# Patient Record
Sex: Female | Born: 1980 | Race: Black or African American | Hispanic: No | Marital: Single | Smoking: Former smoker
Health system: Southern US, Community
[De-identification: ages and names within clinical notes are randomized; demographics above are authoritative.]

## PROBLEM LIST (undated history)

## (undated) DIAGNOSIS — R87619 Unspecified abnormal cytological findings in specimens from cervix uteri: Secondary | ICD-10-CM

## (undated) DIAGNOSIS — K219 Gastro-esophageal reflux disease without esophagitis: Secondary | ICD-10-CM

## (undated) DIAGNOSIS — D649 Anemia, unspecified: Secondary | ICD-10-CM

## (undated) DIAGNOSIS — M1990 Unspecified osteoarthritis, unspecified site: Secondary | ICD-10-CM

## (undated) DIAGNOSIS — F32A Depression, unspecified: Secondary | ICD-10-CM

## (undated) HISTORY — DX: Gastro-esophageal reflux disease without esophagitis: K21.9

## (undated) HISTORY — DX: Unspecified abnormal cytological findings in specimens from cervix uteri: R87.619

## (undated) HISTORY — DX: Depression, unspecified: F32.A

## (undated) HISTORY — DX: Unspecified osteoarthritis, unspecified site: M19.90

## (undated) HISTORY — DX: Anemia, unspecified: D64.9

---
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
## 2005-03-09 ENCOUNTER — Emergency Department: Payer: Self-pay | Admitting: Emergency Medicine

## 2006-05-26 ENCOUNTER — Emergency Department: Payer: Self-pay | Admitting: Emergency Medicine

## 2008-09-15 ENCOUNTER — Emergency Department: Payer: Self-pay | Admitting: Emergency Medicine

## 2009-12-04 ENCOUNTER — Emergency Department: Payer: Self-pay | Admitting: Unknown Physician Specialty

## 2011-03-31 ENCOUNTER — Emergency Department: Payer: Self-pay | Admitting: Emergency Medicine

## 2011-04-07 ENCOUNTER — Emergency Department: Payer: Self-pay | Admitting: Internal Medicine

## 2011-05-29 ENCOUNTER — Emergency Department: Payer: Self-pay | Admitting: *Deleted

## 2011-06-09 ENCOUNTER — Ambulatory Visit: Payer: Self-pay | Admitting: Physician Assistant

## 2012-12-22 ENCOUNTER — Emergency Department: Payer: Self-pay | Admitting: Emergency Medicine

## 2012-12-22 LAB — BASIC METABOLIC PANEL
BUN: 10 mg/dL (ref 7–18)
Calcium, Total: 9.7 mg/dL (ref 8.5–10.1)
Chloride: 107 mmol/L (ref 98–107)
Creatinine: 0.72 mg/dL (ref 0.60–1.30)
EGFR (African American): >60
EGFR (Non-African Amer.): >60
Glucose: 89 mg/dL (ref 65–99)
Osmolality: 270 (ref 275–301)
Potassium: 3.7 mmol/L (ref 3.5–5.1)

## 2012-12-22 LAB — URINALYSIS, COMPLETE
Bilirubin,UR: NEGATIVE
Protein: 30
Squamous Epithelial: 5
WBC UR: 1 /HPF (ref 0–5)

## 2012-12-22 LAB — GC/CHLAMYDIA PROBE AMP

## 2012-12-22 LAB — CBC
HCT: 34.7 % — ABNORMAL LOW (ref 35.0–47.0)
HGB: 11.5 g/dL — ABNORMAL LOW (ref 12.0–16.0)
MCH: 23.8 pg — ABNORMAL LOW (ref 26.0–34.0)
MCHC: 33.2 g/dL (ref 32.0–36.0)
RDW: 14.7 % — ABNORMAL HIGH (ref 11.5–14.5)
WBC: 7.9 10*3/uL (ref 3.6–11.0)

## 2012-12-22 LAB — HCG, QUANTITATIVE, PREGNANCY: Beta Hcg, Quant.: 92605 m[IU]/mL — ABNORMAL HIGH

## 2012-12-22 LAB — WET PREP, GENITAL

## 2013-02-10 ENCOUNTER — Ambulatory Visit: Payer: Self-pay | Admitting: Advanced Practice Midwife

## 2013-03-03 ENCOUNTER — Encounter: Payer: Self-pay | Admitting: Advanced Practice Midwife

## 2013-07-04 ENCOUNTER — Ambulatory Visit: Payer: Self-pay | Admitting: Obstetrics and Gynecology

## 2013-07-04 LAB — CBC WITH DIFFERENTIAL/PLATELET
BASOS ABS: 0 10*3/uL (ref 0.0–0.1)
BASOS PCT: 0.4 %
Eosinophil #: 0 10*3/uL (ref 0.0–0.7)
Eosinophil %: 0.6 %
HCT: 35.5 % (ref 35.0–47.0)
HGB: 11.4 g/dL — AB (ref 12.0–16.0)
Lymphocyte #: 1 10*3/uL (ref 1.0–3.6)
Lymphocyte %: 13.9 %
MCH: 23.7 pg — ABNORMAL LOW (ref 26.0–34.0)
MCHC: 32 g/dL (ref 32.0–36.0)
MCV: 74 fL — AB (ref 80–100)
Monocyte #: 0.6 x10 3/mm (ref 0.2–0.9)
Monocyte %: 8.6 %
Neutrophil #: 5.3 10*3/uL (ref 1.4–6.5)
Neutrophil %: 76.5 %
PLATELETS: 264 10*3/uL (ref 150–440)
RBC: 4.81 10*6/uL (ref 3.80–5.20)
RDW: 15.5 % — ABNORMAL HIGH (ref 11.5–14.5)
WBC: 7 10*3/uL (ref 3.6–11.0)

## 2013-07-07 ENCOUNTER — Inpatient Hospital Stay: Payer: Self-pay | Admitting: Obstetrics and Gynecology

## 2013-07-08 LAB — HEMATOCRIT: HCT: 32.1 % — ABNORMAL LOW (ref 35.0–47.0)

## 2014-09-12 NOTE — Op Note (Signed)
PATIENT NAME:  Meagan Medina, Meagan Medina MR#:  [id number] DATE OF BIRTH:  [birth year]  DATE OF PROCEDURE:  07/07/2013  PREOPERATIVE DIAGNOSES:  1.  Medina 39 + 0 weeks estimated gestational age.  2.  Breech presentation.   POSTOPERATIVE DIAGNOSES:  1.  Medina 39 + 0 weeks estimated gestational age.  2.  Breech presentation.  e PROCEDURE:  1.  Primary low transverse cesarean section.  2.  Breech extraction. 3.  On-Q pump placement.   ANESTHESIA: Spinal.   SURGEON: Jennell Cornerhomas Markeshia Giebel, MD.  INDICATION: This is Medina 34 year old gravida 1, para 0 patient with EDC of 07/14/2013. The patient was noted to be in breech presentation on recent ultrasound. Ultrasound the day of the procedure reconfirms breech presentation.   PROCEDURE: After adequate spinal anesthesia, the patient was placed in the dorsal supine position with Medina hip roll under the right side. The patient's prepped and draped in normal sterile fashion. Medina Pfannenstiel incision was made. Sharp dissection was used to identify the fascia. The fascia was opened in the midline and opened in Medina transverse fashion. The superior aspect of the fascia was grasped with Kocher clamps and the recti muscles dissected free. The inferior aspect of the fascia was grasped with Kocher clamps and the pyramidalis muscle was dissected free. Entry into the peritoneal cavity was accomplished sharply. The vesicouterine peritoneal fold was identified and bladder flap was created and the bladder was reflected inferiorly. Medina low transverse uterine incision was made. Upon entry into the endometrial cavity, clear fluid resulted. The incision was extended with blunt transverse traction. Frank breech was identified. Buttocks was delivered through the incision followed by delivery of the legs without difficulty. Legs were then draped with Medina wound towel and the arms were delivered with Medina medial sweeping motion without difficulty and the head was delivered without difficulty. The cord was doubly  clamped and Medina viable, active female was passed to the neonatologist, who assigned Apgar scores of 8 and 9, weight 3340 grams.  The placenta was manually delivered. The uterus was exteriorized and the endometrial cavity was wiped clean with laparotomy tape. The cervix was opened with Medina ring forceps. The ring forceps was passed off the operative field. The uterine incision was then closed with 1 chromic suture in Medina running and locking fashion with good approximation of edges. Good hemostasis was noted. The posterior cul-de-sac was suctioned and irrigated. The uterus was placed back into the abdominal cavity. Fallopian tubes and ovaries appeared normal. The uterine incision again appeared hemostatic. Interceed was placed over the uterine incision in Medina T-shaped fashion. The superior aspect of the fascia was regrasped with Kocher clamps and the On-Q pump catheters were advanced from an infraumbilical position to Medina subfascial position. The fascia was closed over top of these catheters without difficulty, 0 Vicryl suture used. Good hemostasis. Good approximation of tissues. Subcutaneous tissues were irrigated and bovied for hemostasis. The skin was reapproximated with staples. Dermabond was then used to secure the catheters at the skin level and catheters were Steri-Stripped to the skin and Tegaderm was placed over top of this. Each catheter was loaded with 5 mL of 0.5% Marcaine. There were no complications. Estimated blood loss 500 mL. Intraoperative fluids 1000 mL. The patient was taken to the recovery room in good condition. The patient did receive 2 grams of IV Ancef prior to commencement of the case.   ____________________________ Suzy Bouchardhomas J. Diksha Tagliaferro, MD tjs:aw D: 07/07/2013 09:57:59 ET T: 07/07/2013 10:19:17 ET JOB#: [id number]  cc: Maisie Fushomas  Cloyde Reams, MD, <Dictator> Suzy Bouchard MD ELECTRONICALLY SIGNED 07/08/2013 9:20

## 2019-08-25 ENCOUNTER — Other Ambulatory Visit: Payer: Self-pay

## 2019-08-25 ENCOUNTER — Encounter: Payer: Self-pay | Admitting: Family Medicine

## 2019-08-25 ENCOUNTER — Ambulatory Visit: Payer: Self-pay

## 2019-08-25 ENCOUNTER — Ambulatory Visit (LOCAL_COMMUNITY_HEALTH_CENTER): Payer: Self-pay | Admitting: Family Medicine

## 2019-08-25 VITALS — BP 121/80 | Ht 65.0 in | Wt 198.8 lb

## 2019-08-25 DIAGNOSIS — B9689 Other specified bacterial agents as the cause of diseases classified elsewhere: Secondary | ICD-10-CM

## 2019-08-25 DIAGNOSIS — Z3009 Encounter for other general counseling and advice on contraception: Secondary | ICD-10-CM

## 2019-08-25 DIAGNOSIS — Z113 Encounter for screening for infections with a predominantly sexual mode of transmission: Secondary | ICD-10-CM

## 2019-08-25 DIAGNOSIS — Z30013 Encounter for initial prescription of injectable contraceptive: Secondary | ICD-10-CM

## 2019-08-25 DIAGNOSIS — N6310 Unspecified lump in the right breast, unspecified quadrant: Secondary | ICD-10-CM | POA: Insufficient documentation

## 2019-08-25 LAB — WET PREP FOR TRICH, YEAST, CLUE
Trichomonas Exam: NEGATIVE
Yeast Exam: NEGATIVE

## 2019-08-25 MED ORDER — METRONIDAZOLE 500 MG PO TABS: 500.0000 mg | ORAL_TABLET | Freq: Two times a day (BID) | ORAL | 0 refills | Status: AC

## 2019-08-25 MED ORDER — MEDROXYPROGESTERONE ACETATE 150 MG/ML IM SUSP
150.0000 mg | INTRAMUSCULAR | Status: DC
  Administered 2019-08-25: 150 mg via INTRAMUSCULAR

## 2019-08-25 NOTE — Progress Notes (Signed)
Family Planning Visit  Subjective:  Meagan Medina is a 39 y.o. being seen today for  Chief Complaint  Patient presents with  . Contraception  . Exposure to STD    Pt has Breast mass, right on their problem list.  HPI  Patient reports she is here for STI screening and BCM. She has had discharge x1 wk, has had BV in the past. Denies abd pain, n/v, fever. Last sex ~3 yrs ago.  She has been on Depo in the past, would like to restart this.  Pt denies all of the following, which are contraindications to Depo use: Known breast cancer Pregnancy Also denies: Hypertension (CDC cat 2 if mild, cat 3 if severe) Severe cirrhosis, hepatocellular adenoma Diabetes with nephrosis or vascular complications Ischemic heart disease or multiple risk factors for atherosclerotic disease, and some forms of lupus Unexplained vaginal bleeding Pregnancy planned within the next year Long-term use of corticosteroid therapy in women with a history of, or risk factors for, nontraumatic (frailty) fractures.  Current use of aminoglutethimide (usually for the treatment of Cushing's syndrome) because aminoglutethimide may increase metabolism of progestins   Patient's last menstrual period was 07/31/2019 (exact date). Last sex: 3 years ago BCM: none Pt desires EC? n/a  Last pap: 2019, results normal per pt. She has remote hx of abnormal paps but states this one was ok, only regular follow up needed.  Last breast exam: 2019. No family hx of breast cancer. Has known mass on R breast near nipple since age 60, was tested at [facility], told benign. States it has been getting smaller in recent years.   Patient reports 0 partner(s) in last year. Do they desire STI screening (if no, why not)? Yes, but declines HIV and syphilis screening.   Does the patient desire a pregnancy in the next year? No    38 y.o., Body mass index is 33.08 kg/m. - Is patient eligible for HA1C diabetes screening based on BMI and age >50?   yes  Does the patient have a current or past history of drug use? no No components found for: HCV  See flowsheet for other program required questions.   Health Maintenance Due  Topic Date Due  . HIV Screening  Never done  . TETANUS/TDAP  Never done  . PAP SMEAR-Modifier  Never done    ROS  The following portions of the patient's history were reviewed and updated as appropriate: allergies, current medications, past family history, past medical history, past social history, past surgical history and problem list. Problem list updated.  Objective:  BP 121/80   Ht 5\' 5"  (1.651 m)   Wt 198 lb 12.8 oz (90.2 kg)   LMP 07/31/2019 (Exact Date)   BMI 33.08 kg/m    Physical Exam Vitals and nursing note reviewed.  Constitutional:      Appearance: Normal appearance.  HENT:     Head: Normocephalic and atraumatic.     Mouth/Throat:     Mouth: Mucous membranes are moist.     Pharynx: Oropharynx is clear. No oropharyngeal exudate or posterior oropharyngeal erythema.  Pulmonary:     Effort: Pulmonary effort is normal.  Chest:     Breasts:        Right: Mass present. No swelling, bleeding, inverted nipple, nipple discharge, skin change or tenderness.        Left: No swelling, bleeding, inverted nipple, mass, nipple discharge, skin change or tenderness.    Abdominal:     General: Abdomen is flat.  Palpations: There is no mass.     Tenderness: There is no abdominal tenderness. There is no rebound.  Genitourinary:    General: Normal vulva.     Exam position: Lithotomy position.     Pubic Area: No rash or pubic lice.      Labia:        Right: No rash or lesion.        Left: No rash or lesion.      Vagina: Vaginal discharge (white, small amount, ph>4.5) present. No erythema, bleeding or lesions.     Cervix: No cervical motion tenderness, discharge, friability, lesion or erythema.     Uterus: Normal.      Adnexa: Right adnexa normal and left adnexa normal.     Rectum: Normal.   Lymphadenopathy:     Head:     Right side of head: No preauricular or posterior auricular adenopathy.     Left side of head: No preauricular or posterior auricular adenopathy.     Cervical: No cervical adenopathy.     Upper Body:     Right upper body: No supraclavicular or axillary adenopathy.     Left upper body: No supraclavicular or axillary adenopathy.     Lower Body: No right inguinal adenopathy. No left inguinal adenopathy.  Skin:    General: Skin is warm and dry.     Findings: No rash.  Neurological:     Mental Status: She is alert and oriented to person, place, and time.        Assessment and Plan:  Meagan Medina is a 39 y.o. female presenting to the [facility] Department for a well woman exam/family planning visit  Contraception counseling: Reviewed all forms of birth control options in the tiered based approach including abstinence; over the counter/barrier methods; hormonal contraceptive medication including pill, patch, ring, injection, contraceptive implant; hormonal and nonhormonal IUDs; permanent sterilization options including vasectomy and the various tubal sterilization modalities. Risks, benefits, how to discontinue and typical effectiveness rates were reviewed.  Questions were answered.  Written information was also given to the patient to review.  Patient desires depo, this was prescribed for patient. She will follow up in  3 months for surveillance.  She was told to call with any further questions, or with any concerns about this method of contraception.  Emphasized use of condoms 100% of the time for STI prevention.  Emergency Contraception: n/a d/t last intercourse 3 yrs ago.   1. Encounter for initial prescription of injectable contraceptive -Rx depo x1 yr. Counseling as above.  - medroxyPROGESTERone (DEPO-PROVERA) injection 150 mg  2. Screening examination for venereal disease -Pt with symptoms. Screenings today as below. Treat wet prep per  standing order. -Patient doesnot meet criteria for HepB, HepC Screening. Declines HIV and syphilis screenings. -Counseled on warning s/sx and when to seek care. Recommended condom use with all sex and discussed importance of condom use for STI prevention. - WET PREP FOR [place], YEAST, [place] [facility]  3. Family planning services -A1c today.  -Pt to sign for pap results, obtained in 2019 and negative per pt.  - Hgb A1c w/o eAG  4. Breast mass, right -Small mobile mass at 6:00 on R areola. Pt states this has been present since age 50, was biopsied in [place] and results benign, states it is getting smaller in past several years. Advised to let us know if it changes/enlargens or if any other breast masses appear.  Return in about 3 months (around 11/24/2019) for Depo.  No future appointments.  Ann Held, PA-C

## 2019-08-25 NOTE — Progress Notes (Signed)
Patient here for STD testing, states allergy to ibuprofen (vomiting, passing out).

## 2019-08-25 NOTE — Progress Notes (Addendum)
Wet mount reviewed, patient treated for BV per SO. Depo given, left deltoid, tolerated well, next Depo card given. ROI for last paps unable to be faxed to [facility], as location is closed and phone number disconnected. Patient needs to sign new ROI for last paps at her next Depo appointment around 11/10/2019. Provider aware of situation.Marland KitchenMarland KitchenBurt Knack, RN

## 2019-08-25 NOTE — Progress Notes (Signed)
Patient here for STD testing, appointment changed to include contraceptive counseling. Currently not using BCM, last sex about 3 years ago.Burt Knack, RN

## 2019-08-26 LAB — HGB A1C W/O EAG: Hgb A1c MFr Bld: 6.1 % — ABNORMAL HIGH (ref 4.8–5.6)

## 2019-08-26 NOTE — Progress Notes (Signed)
This encounter was created in error - please disregard.

## 2019-08-29 ENCOUNTER — Telehealth: Payer: Self-pay

## 2019-08-29 NOTE — Telephone Encounter (Signed)
Phone call received back from pt. Pt informed of A1c result, counseled and provider recommendations given per Samara Snide, PA orders. Pt states she does not have a PCP that she normally goes to; offered to provide a PCP list; pt to look up PCP on-line.

## 2019-08-29 NOTE — Telephone Encounter (Signed)
See provider notes

## 2019-08-29 NOTE — Telephone Encounter (Signed)
Phone call to pt. Left message on voicemail that RN with [facility] is calling for Meagan Medina, and we will try to call her back again. Our number is [phone].

## 2019-08-29 NOTE — Telephone Encounter (Signed)
TC to patient to discuss need for PCP follow-up for Hgb A1c 6.1 which indicates pre-diabetes, see provider notes. LM with number to call.Burt Knack, RN

## 2019-08-29 NOTE — Telephone Encounter (Signed)
-----   Message from Ann Held, PA-C sent at 08/26/2019  1:24 PM EDT ----- Please let pt know their A1c result is high at 6.1. They do not yet have diabetes but they are at risk for diabetes, a condition known as "pre-diabetes." We recommend they contact a primary care provider to follow up on this for further evaluation/recommendations.

## 2019-10-17 ENCOUNTER — Emergency Department: Payer: Medicaid Other

## 2019-10-17 ENCOUNTER — Emergency Department
Admission: EM | Admit: 2019-10-17 | Discharge: 2019-10-17 | Disposition: A | Discharge status: Home / Self Care | Payer: Medicaid Other | Attending: Emergency Medicine | Admitting: Emergency Medicine

## 2019-10-17 ENCOUNTER — Other Ambulatory Visit: Payer: Self-pay

## 2019-10-17 ENCOUNTER — Encounter: Payer: Self-pay | Admitting: Emergency Medicine

## 2019-10-17 DIAGNOSIS — M25531 Pain in right wrist: Secondary | ICD-10-CM | POA: Diagnosis not present

## 2019-10-17 DIAGNOSIS — Z87891 Personal history of nicotine dependence: Secondary | ICD-10-CM | POA: Diagnosis not present

## 2019-10-17 MED ORDER — PREDNISONE 10 MG PO TABS: 40.0000 mg | ORAL_TABLET | Freq: Every day | ORAL | 0 refills | Status: AC

## 2019-10-17 NOTE — ED Triage Notes (Signed)
Pt reports pain to right wrist for the past few days. Denies injuries

## 2019-10-17 NOTE — ED Notes (Signed)
See triage note Presents with right hand and wrist pain   Describes pain as burning in hand and radiates into wrist  Good pulses
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
## 2019-11-04 DIAGNOSIS — G5601 Carpal tunnel syndrome, right upper limb: Secondary | ICD-10-CM | POA: Diagnosis not present

## 2019-12-21 DIAGNOSIS — Z419 Encounter for procedure for purposes other than remedying health state, unspecified: Secondary | ICD-10-CM | POA: Diagnosis not present

## 2020-01-21 DIAGNOSIS — Z419 Encounter for procedure for purposes other than remedying health state, unspecified: Secondary | ICD-10-CM | POA: Diagnosis not present

## 2020-02-20 DIAGNOSIS — Z419 Encounter for procedure for purposes other than remedying health state, unspecified: Secondary | ICD-10-CM | POA: Diagnosis not present

## 2020-03-22 DIAGNOSIS — Z419 Encounter for procedure for purposes other than remedying health state, unspecified: Secondary | ICD-10-CM | POA: Diagnosis not present

## 2020-03-23 ENCOUNTER — Ambulatory Visit: Payer: Self-pay | Admitting: Family Medicine

## 2020-04-21 DIAGNOSIS — Z419 Encounter for procedure for purposes other than remedying health state, unspecified: Secondary | ICD-10-CM | POA: Diagnosis not present

## 2020-05-22 DIAGNOSIS — Z419 Encounter for procedure for purposes other than remedying health state, unspecified: Secondary | ICD-10-CM | POA: Diagnosis not present

## 2020-05-27 ENCOUNTER — Encounter: Payer: Self-pay | Admitting: Family Medicine

## 2020-05-27 ENCOUNTER — Ambulatory Visit (INDEPENDENT_AMBULATORY_CARE_PROVIDER_SITE_OTHER): Payer: Medicaid Other | Admitting: Family Medicine

## 2020-05-27 ENCOUNTER — Other Ambulatory Visit: Payer: Self-pay

## 2020-05-27 VITALS — BP 116/73 | HR 95 | Temp 98.2°F | Resp 18 | Ht 65.0 in | Wt 203.0 lb

## 2020-05-27 DIAGNOSIS — Z23 Encounter for immunization: Secondary | ICD-10-CM | POA: Diagnosis not present

## 2020-05-27 DIAGNOSIS — K219 Gastro-esophageal reflux disease without esophagitis: Secondary | ICD-10-CM | POA: Diagnosis not present

## 2020-05-27 DIAGNOSIS — Z7689 Persons encountering health services in other specified circumstances: Secondary | ICD-10-CM

## 2020-05-27 DIAGNOSIS — H60502 Unspecified acute noninfective otitis externa, left ear: Secondary | ICD-10-CM | POA: Diagnosis not present

## 2020-05-27 MED ORDER — FAMOTIDINE 10 MG PO TABS: 10.0000 mg | ORAL_TABLET | Freq: Two times a day (BID) | ORAL | 1 refills | Status: DC

## 2020-05-27 MED ORDER — NEOMYCIN-POLYMYXIN-HC 3.5-10000-1 OT SOLN: 4.0000 [drp] | Freq: Four times a day (QID) | OTIC | 0 refills | Status: DC

## 2020-05-27 NOTE — Assessment & Plan Note (Signed)
Previously treated with ranitidine and then famotidine with good relief of symptoms.  Interested in restarting on famotidine.  Will restart on famotidine 10mg  BID for GERD.  To f/u if symptoms worsen or fail to improve

## 2020-05-27 NOTE — Progress Notes (Signed)
Subjective:    Patient ID: Meagan Medina, female    DOB: 15-Dec-1980, 40 y.o.   MRN: [id number]  Meagan Medina is a 40 y.o. female presenting on 05/27/2020 for Establish Care, Ear Pain (Constant Left Ear pain w/ fullness x 2 weeks), and Gastroesophageal Reflux (Acid reflux.Pt state certain foods and beverages like coffee, pizza makes her vomit. Cough that she associates with her acid reflux x 1 mth)   HPI  Meagan Medina presents to clinic to establish as a new patient for primary care.  Reports that her previous PCP was at [facility].  Records will be requested.  Past medical, family, and surgical history reviewed w/ pt.  Reports acute concerns for worsening GERD and left ear pain x 2 weeks.  Reports that she was previously on ranitidine and then famotidine in the past with good control, has been out of her prescription, which she is finding has been having some vomiting over the past month with foods that have exacerbated her GERD in the past.  Has left ear pain, that is reported as fullness with discharge x 2 weeks.  Denies recent swimming, putting anything into the ear, popping, clicking, fever, recent illness/fevers.   No flowsheet data found.  Social History   Tobacco Use  . Smoking status: Former Smoker    Years: 1.00    Quit date: 2003    Years since quitting: 19.0  . Smokeless tobacco: Never Used  Vaping Use  . Vaping Use: Never used  Substance Use Topics  . Alcohol use: Not Currently  . Drug use: Never    Review of Systems  Constitutional: Negative.   HENT: Positive for ear discharge and ear pain. Negative for congestion, dental problem, drooling, facial swelling, hearing loss, mouth sores, nosebleeds, postnasal drip, rhinorrhea, sinus pressure, sinus pain, sneezing, sore throat, tinnitus, trouble swallowing and voice change.   Eyes: Negative.   Respiratory: Negative.   Cardiovascular: Negative.   Gastrointestinal: Positive for nausea and vomiting. Negative for  abdominal distention, abdominal pain, anal bleeding, blood in stool, constipation, diarrhea and rectal pain.       Increased GERD  Endocrine: Negative.   Genitourinary: Negative.   Musculoskeletal: Negative.   Skin: Negative.   Allergic/Immunologic: Negative.   Neurological: Negative.   Hematological: Negative.   Psychiatric/Behavioral: Negative.    Per HPI unless specifically indicated above     Objective:    BP 116/73 (BP Location: Left Arm, Patient Position: Sitting, Cuff Size: Normal)   Pulse 95   Temp 98.2 F (36.8 C) (Temporal)   Resp 18   Ht 5\' 5"  (1.651 m)   Wt 203 lb (92.1 kg)   SpO2 99%   BMI 33.78 kg/m   Wt Readings from Last 3 Encounters:  05/27/20 203 lb (92.1 kg)  10/17/19 198 lb (89.8 kg)  08/25/19 198 lb 12.8 oz (90.2 kg)    Physical Exam Vitals and nursing note reviewed.  Constitutional:      General: She is not in acute distress.    Appearance: Normal appearance. She is well-developed and well-groomed. She is not ill-appearing or toxic-appearing.  HENT:     Head: Normocephalic and atraumatic.     Right Ear: Tympanic membrane, ear canal and external ear normal. No drainage, swelling or tenderness. No middle ear effusion. There is no impacted cerumen. No mastoid tenderness. Tympanic membrane is not injected, scarred, perforated, erythematous, retracted or bulging.     Left Ear: Drainage, swelling and tenderness present.  No middle ear effusion. No mastoid tenderness. Tympanic membrane is not injected, scarred, perforated, erythematous, retracted or bulging.     Nose:     Comments: Lizbeth Bark is in place, covering mouth and nose. Eyes:     General: Lids are normal. Vision grossly intact.        Right eye: No discharge.        Left eye: No discharge.     Extraocular Movements: Extraocular movements intact.     Conjunctiva/sclera: Conjunctivae normal.     Pupils: Pupils are equal, round, and reactive to light.  Cardiovascular:     Rate and Rhythm: Normal  rate and regular rhythm.     Pulses: Normal pulses.     Heart sounds: Normal heart sounds. No murmur heard. No friction rub. No gallop.   Pulmonary:     Effort: Pulmonary effort is normal. No respiratory distress.     Breath sounds: Normal breath sounds.  Skin:    General: Skin is warm and dry.     Capillary Refill: Capillary refill takes less than 2 seconds.  Neurological:     General: No focal deficit present.     Mental Status: She is alert and oriented to person, place, and time.  Psychiatric:        Attention and Perception: Attention and perception normal.        Mood and Affect: Mood and affect normal.        Speech: Speech normal.        Behavior: Behavior normal. Behavior is cooperative.        Thought Content: Thought content normal.        Cognition and Memory: Cognition and memory normal.        Judgment: Judgment normal.    Results for orders placed or performed in visit on 08/25/19  WET PREP FOR [place], YEAST, CLUE  Result Value Ref Range   Trichomonas Exam Negative Negative   Yeast Exam Negative Negative   Clue Cell Exam Comment: Negative  Hgb A1c w/o eAG  Result Value Ref Range   Hgb A1c MFr Bld 6.1 (H) 4.8 - 5.6 %      Assessment & Plan:   Problem List Items Addressed This Visit      Digestive   Gastroesophageal reflux disease    Previously treated with ranitidine and then famotidine with good relief of symptoms.  Interested in restarting on famotidine.  Will restart on famotidine 10mg  BID for GERD.  To f/u if symptoms worsen or fail to improve      Relevant Medications   famotidine (PEPCID) 10 MG tablet     Nervous and Auditory   Acute otitis externa of left ear    Left otitis externa will treat with cortisporin ear drops in left ear 4x daily until symptoms resolve.  RTC if symptoms worsen or fail to improve      Relevant Medications   neomycin-polymyxin-hydrocortisone (CORTISPORIN) OTIC solution     Other   Encounter to establish care with new  doctor - Primary    New patient to establish for primary care.  Will RTC in 6 months for CPE      Need for Tdap vaccination    Pt needs tetanus vaccine.  > 10 years since last vaccination.  VIS provided.  Plan: 1. Reviewed tetanus disease and need for vaccination. 2. Administer vaccine today.       Relevant Orders   Tdap vaccine greater than or equal to 7yo IM (  Completed)      Meds ordered this encounter  Medications  . famotidine (PEPCID) 10 MG tablet    Sig: Take 1 tablet (10 mg total) by mouth 2 (two) times daily.    Dispense:  180 tablet    Refill:  1  . neomycin-polymyxin-hydrocortisone (CORTISPORIN) OTIC solution    Sig: Place 4 drops into the left ear 4 (four) times daily.    Dispense:  10 mL    Refill:  0   Follow up plan: Return in about 6 months (around 11/24/2020), or if symptoms worsen or fail to improve, for CPE.   Charlaine Dalton, FNP Family Nurse Practitioner [facility] 05/27/2020, 12:54 PM

## 2020-05-27 NOTE — Assessment & Plan Note (Signed)
Left otitis externa will treat with cortisporin ear drops in left ear 4x daily until symptoms resolve.  RTC if symptoms worsen or fail to improve

## 2020-05-27 NOTE — Patient Instructions (Addendum)
I have sent in a prescription for famotidine 10mg  to take 1 tablet 2x per day for your acid reflux.  Avoid citrus and spicy foods as well as chocolate and caffeine to help reduce your symptoms.  I have sent in a prescription for an ear drop for your left ear to put 4 drops into the left ear 4x per day until your symptoms resolve.    We will plan to see you back in 6 months for your physical and acid reflux follow up visit  You will receive a survey after today's visit either digitally by e-mail or paper by USPS mail. Your experiences and feedback matter to .  Please respond so we know how we are doing as we provide care for you.  Call us with any questions/concerns/needs.  It is my goal to be available to you for your health concerns.  Thanks for choosing me to be a partner in your healthcare needs!  [place], FNP-C Family Nurse Practitioner [facility] Phone: [phone]

## 2020-05-27 NOTE — Assessment & Plan Note (Signed)
New patient to establish for primary care.  Will RTC in 6 months for CPE

## 2020-05-27 NOTE — Assessment & Plan Note (Signed)
Pt needs tetanus vaccine.  > 10 years since last vaccination.  VIS provided.  Plan: 1. Reviewed tetanus disease and need for vaccination. 2. Administer vaccine today.  

## 2020-06-22 DIAGNOSIS — Z419 Encounter for procedure for purposes other than remedying health state, unspecified: Secondary | ICD-10-CM | POA: Diagnosis not present

## 2020-06-24 ENCOUNTER — Telehealth: Payer: Self-pay | Admitting: Licensed Clinical Social Worker

## 2020-06-24 NOTE — Telephone Encounter (Signed)
Patient left vm for LCSW on 06/23/20.

## 2020-08-01 DIAGNOSIS — F321 Major depressive disorder, single episode, moderate: Secondary | ICD-10-CM | POA: Diagnosis not present

## 2020-08-02 DIAGNOSIS — F321 Major depressive disorder, single episode, moderate: Secondary | ICD-10-CM | POA: Diagnosis not present

## 2020-08-03 DIAGNOSIS — F321 Major depressive disorder, single episode, moderate: Secondary | ICD-10-CM | POA: Diagnosis not present

## 2020-08-04 DIAGNOSIS — F321 Major depressive disorder, single episode, moderate: Secondary | ICD-10-CM | POA: Diagnosis not present

## 2020-08-05 DIAGNOSIS — F321 Major depressive disorder, single episode, moderate: Secondary | ICD-10-CM | POA: Diagnosis not present

## 2020-08-06 DIAGNOSIS — F321 Major depressive disorder, single episode, moderate: Secondary | ICD-10-CM | POA: Diagnosis not present

## 2020-08-07 DIAGNOSIS — F321 Major depressive disorder, single episode, moderate: Secondary | ICD-10-CM | POA: Diagnosis not present

## 2020-08-08 DIAGNOSIS — F321 Major depressive disorder, single episode, moderate: Secondary | ICD-10-CM | POA: Diagnosis not present

## 2020-08-09 DIAGNOSIS — F321 Major depressive disorder, single episode, moderate: Secondary | ICD-10-CM | POA: Diagnosis not present

## 2020-08-10 DIAGNOSIS — F321 Major depressive disorder, single episode, moderate: Secondary | ICD-10-CM | POA: Diagnosis not present

## 2020-08-11 DIAGNOSIS — F321 Major depressive disorder, single episode, moderate: Secondary | ICD-10-CM | POA: Diagnosis not present

## 2020-08-12 DIAGNOSIS — F321 Major depressive disorder, single episode, moderate: Secondary | ICD-10-CM | POA: Diagnosis not present

## 2020-08-13 DIAGNOSIS — F321 Major depressive disorder, single episode, moderate: Secondary | ICD-10-CM | POA: Diagnosis not present

## 2020-08-14 DIAGNOSIS — F321 Major depressive disorder, single episode, moderate: Secondary | ICD-10-CM | POA: Diagnosis not present

## 2020-08-15 DIAGNOSIS — F321 Major depressive disorder, single episode, moderate: Secondary | ICD-10-CM | POA: Diagnosis not present

## 2020-08-16 DIAGNOSIS — F321 Major depressive disorder, single episode, moderate: Secondary | ICD-10-CM | POA: Diagnosis not present

## 2020-08-17 DIAGNOSIS — F321 Major depressive disorder, single episode, moderate: Secondary | ICD-10-CM | POA: Diagnosis not present

## 2020-08-18 DIAGNOSIS — F321 Major depressive disorder, single episode, moderate: Secondary | ICD-10-CM | POA: Diagnosis not present

## 2020-08-19 DIAGNOSIS — F321 Major depressive disorder, single episode, moderate: Secondary | ICD-10-CM | POA: Diagnosis not present

## 2020-08-20 DIAGNOSIS — F321 Major depressive disorder, single episode, moderate: Secondary | ICD-10-CM | POA: Diagnosis not present

## 2020-08-21 DIAGNOSIS — F321 Major depressive disorder, single episode, moderate: Secondary | ICD-10-CM | POA: Diagnosis not present

## 2020-08-22 DIAGNOSIS — F321 Major depressive disorder, single episode, moderate: Secondary | ICD-10-CM | POA: Diagnosis not present

## 2020-08-23 DIAGNOSIS — F321 Major depressive disorder, single episode, moderate: Secondary | ICD-10-CM | POA: Diagnosis not present

## 2020-08-24 DIAGNOSIS — F321 Major depressive disorder, single episode, moderate: Secondary | ICD-10-CM | POA: Diagnosis not present

## 2020-08-25 DIAGNOSIS — F321 Major depressive disorder, single episode, moderate: Secondary | ICD-10-CM | POA: Diagnosis not present

## 2020-08-26 DIAGNOSIS — F321 Major depressive disorder, single episode, moderate: Secondary | ICD-10-CM | POA: Diagnosis not present

## 2020-08-27 DIAGNOSIS — F321 Major depressive disorder, single episode, moderate: Secondary | ICD-10-CM | POA: Diagnosis not present

## 2020-08-28 DIAGNOSIS — F321 Major depressive disorder, single episode, moderate: Secondary | ICD-10-CM | POA: Diagnosis not present

## 2020-08-29 DIAGNOSIS — F321 Major depressive disorder, single episode, moderate: Secondary | ICD-10-CM | POA: Diagnosis not present

## 2020-08-30 DIAGNOSIS — F321 Major depressive disorder, single episode, moderate: Secondary | ICD-10-CM | POA: Diagnosis not present

## 2020-08-31 DIAGNOSIS — F321 Major depressive disorder, single episode, moderate: Secondary | ICD-10-CM | POA: Diagnosis not present

## 2020-09-01 ENCOUNTER — Other Ambulatory Visit: Payer: Self-pay

## 2020-09-01 DIAGNOSIS — F321 Major depressive disorder, single episode, moderate: Secondary | ICD-10-CM | POA: Diagnosis not present

## 2020-09-01 NOTE — Patient Outreach (Signed)
  Medicaid Managed Care Social Work Note  09/01/2020 Name:  Meagan Medina MRN:  [id number] DOB:  1980-06-17  Meagan Medina is an 40 y.o. year old female who is a primary patient of Anitra Lauth, Jodelle Gross, FNP.  The [facility] Managed Care Coordination team was consulted for assistance with:  Transportation Needs   Ms. Leite was given information about Medicaid Managed CareCoordination services today. Inocente Salles agreed to services and verbal consent obtained.  Engaged with patient  for by telephone forinitial visit in response to referral for case management and/or care coordination services.   Assessments/Interventions:  Review of past medical history, allergies, medications, health status, including review of consultants reports, laboratory and other test data, was performed as part of comprehensive evaluation and provision of chronic care management services.  SDOH: (Social Determinant of Health) assessments and interventions performed:  BSW contacted patient regarding wellcare survey completed for needing transportation. Patient stated transportation is no longer needed. Services and resources are not needed at this time.   Advanced Directives Status:  Not addressed in this encounter.  Care Plan                 Allergies  Allergen Reactions  . Ibuprofen Nausea And Vomiting and Other (See Comments)    Medications Reviewed Today    Reviewed by Tarri Fuller, FNP (Family Nurse Practitioner) on 05/27/20 at 1255  Med List Status: <None>  Medication Order Taking? Sig Documenting Provider Last Dose Status Informant  famotidine (PEPCID) 10 MG tablet [id number] Yes Take 1 tablet (10 mg total) by mouth 2 (two) times daily. Tarri Fuller, FNP  Active   neomycin-polymyxin-hydrocortisone (CORTISPORIN) OTIC solution [id number] Yes Place 4 drops into the left ear 4 (four) times daily. Tarri Fuller, FNP  Active           Patient Active Problem List   Diagnosis Date Noted  . Encounter  to establish care with new doctor 05/27/2020  . Acute otitis externa of left ear 05/27/2020  . Gastroesophageal reflux disease 05/27/2020  . Need for Tdap vaccination 05/27/2020  . Breast mass, right 08/25/2019    Conditions to be addressed/monitored per PCP order:  transportation  There are no care plans that you recently modified to display for this patient.   Follow up:  Patient requests no follow-up at this time.  Plan: The patient has been provided with contact information for the Managed Medicaid care management team and has been advised to call with any health related questions or concerns.    Gus Puma, BSW, [facility]  [place]  High Risk Managed Medicaid Team

## 2020-09-01 NOTE — Patient Instructions (Signed)
Visit Information  Meagan Medina was given information about Medicaid Managed Care team care coordination services as a part of their [facility] Medicaid benefit. Meagan Medina did not consent to engagement with the [facility] Managed Care team.   For questions related to your [facility] health plan, please call: [phone]  If you would like to schedule transportation through your [facility] plan, please call the following number at least 2 days in advance of your appointment: [phone]   Call the [facility] Crisis Line at [phone], at any time, 24 hours a day, 7 days a week. If you are in danger or need immediate medical attention call 911.  Meagan Medina - following are the goals we discussed in your visit today:  Goals Addressed   None     The patient has been provided with contact information for the Managed Medicaid care management team and has been advised to call with any health related questions or concerns.   Meagan Medina, BSW, [facility]  [place]  High Risk Managed Medicaid Team    Following is a copy of your plan of care:  There are no care plans to display for this patient.

## 2020-09-02 DIAGNOSIS — F321 Major depressive disorder, single episode, moderate: Secondary | ICD-10-CM | POA: Diagnosis not present

## 2020-09-03 DIAGNOSIS — F321 Major depressive disorder, single episode, moderate: Secondary | ICD-10-CM | POA: Diagnosis not present

## 2020-09-04 DIAGNOSIS — F321 Major depressive disorder, single episode, moderate: Secondary | ICD-10-CM | POA: Diagnosis not present

## 2020-09-05 DIAGNOSIS — F321 Major depressive disorder, single episode, moderate: Secondary | ICD-10-CM | POA: Diagnosis not present

## 2020-09-06 DIAGNOSIS — F321 Major depressive disorder, single episode, moderate: Secondary | ICD-10-CM | POA: Diagnosis not present

## 2020-09-07 DIAGNOSIS — F321 Major depressive disorder, single episode, moderate: Secondary | ICD-10-CM | POA: Diagnosis not present

## 2020-09-08 DIAGNOSIS — F321 Major depressive disorder, single episode, moderate: Secondary | ICD-10-CM | POA: Diagnosis not present

## 2020-09-09 DIAGNOSIS — F321 Major depressive disorder, single episode, moderate: Secondary | ICD-10-CM | POA: Diagnosis not present

## 2020-09-10 DIAGNOSIS — F321 Major depressive disorder, single episode, moderate: Secondary | ICD-10-CM | POA: Diagnosis not present

## 2020-09-11 DIAGNOSIS — F321 Major depressive disorder, single episode, moderate: Secondary | ICD-10-CM | POA: Diagnosis not present

## 2020-09-12 DIAGNOSIS — F321 Major depressive disorder, single episode, moderate: Secondary | ICD-10-CM | POA: Diagnosis not present

## 2020-09-13 DIAGNOSIS — F321 Major depressive disorder, single episode, moderate: Secondary | ICD-10-CM | POA: Diagnosis not present

## 2020-09-14 DIAGNOSIS — F321 Major depressive disorder, single episode, moderate: Secondary | ICD-10-CM | POA: Diagnosis not present

## 2020-09-15 DIAGNOSIS — F321 Major depressive disorder, single episode, moderate: Secondary | ICD-10-CM | POA: Diagnosis not present

## 2020-09-16 DIAGNOSIS — F321 Major depressive disorder, single episode, moderate: Secondary | ICD-10-CM | POA: Diagnosis not present

## 2020-09-17 DIAGNOSIS — F321 Major depressive disorder, single episode, moderate: Secondary | ICD-10-CM | POA: Diagnosis not present

## 2020-09-18 DIAGNOSIS — F321 Major depressive disorder, single episode, moderate: Secondary | ICD-10-CM | POA: Diagnosis not present

## 2020-09-19 DIAGNOSIS — F321 Major depressive disorder, single episode, moderate: Secondary | ICD-10-CM | POA: Diagnosis not present

## 2020-09-20 DIAGNOSIS — F321 Major depressive disorder, single episode, moderate: Secondary | ICD-10-CM | POA: Diagnosis not present

## 2020-09-21 DIAGNOSIS — F321 Major depressive disorder, single episode, moderate: Secondary | ICD-10-CM | POA: Diagnosis not present

## 2020-09-22 DIAGNOSIS — F321 Major depressive disorder, single episode, moderate: Secondary | ICD-10-CM | POA: Diagnosis not present

## 2020-09-23 DIAGNOSIS — F321 Major depressive disorder, single episode, moderate: Secondary | ICD-10-CM | POA: Diagnosis not present

## 2020-09-24 DIAGNOSIS — F321 Major depressive disorder, single episode, moderate: Secondary | ICD-10-CM | POA: Diagnosis not present

## 2020-09-25 DIAGNOSIS — F321 Major depressive disorder, single episode, moderate: Secondary | ICD-10-CM | POA: Diagnosis not present

## 2020-09-26 DIAGNOSIS — F321 Major depressive disorder, single episode, moderate: Secondary | ICD-10-CM | POA: Diagnosis not present

## 2020-09-27 DIAGNOSIS — F321 Major depressive disorder, single episode, moderate: Secondary | ICD-10-CM | POA: Diagnosis not present

## 2020-09-28 DIAGNOSIS — F321 Major depressive disorder, single episode, moderate: Secondary | ICD-10-CM | POA: Diagnosis not present

## 2020-09-29 DIAGNOSIS — F321 Major depressive disorder, single episode, moderate: Secondary | ICD-10-CM | POA: Diagnosis not present

## 2020-09-30 DIAGNOSIS — F321 Major depressive disorder, single episode, moderate: Secondary | ICD-10-CM | POA: Diagnosis not present

## 2020-10-01 DIAGNOSIS — F321 Major depressive disorder, single episode, moderate: Secondary | ICD-10-CM | POA: Diagnosis not present

## 2020-10-02 DIAGNOSIS — F321 Major depressive disorder, single episode, moderate: Secondary | ICD-10-CM | POA: Diagnosis not present

## 2020-10-03 DIAGNOSIS — F321 Major depressive disorder, single episode, moderate: Secondary | ICD-10-CM | POA: Diagnosis not present

## 2020-10-04 DIAGNOSIS — F321 Major depressive disorder, single episode, moderate: Secondary | ICD-10-CM | POA: Diagnosis not present

## 2020-10-05 DIAGNOSIS — F321 Major depressive disorder, single episode, moderate: Secondary | ICD-10-CM | POA: Diagnosis not present

## 2020-10-06 DIAGNOSIS — F321 Major depressive disorder, single episode, moderate: Secondary | ICD-10-CM | POA: Diagnosis not present

## 2020-10-07 DIAGNOSIS — F321 Major depressive disorder, single episode, moderate: Secondary | ICD-10-CM | POA: Diagnosis not present

## 2020-10-08 DIAGNOSIS — F321 Major depressive disorder, single episode, moderate: Secondary | ICD-10-CM | POA: Diagnosis not present

## 2020-10-09 DIAGNOSIS — F321 Major depressive disorder, single episode, moderate: Secondary | ICD-10-CM | POA: Diagnosis not present

## 2020-10-10 DIAGNOSIS — F321 Major depressive disorder, single episode, moderate: Secondary | ICD-10-CM | POA: Diagnosis not present

## 2020-10-11 DIAGNOSIS — F321 Major depressive disorder, single episode, moderate: Secondary | ICD-10-CM | POA: Diagnosis not present

## 2020-10-12 DIAGNOSIS — F321 Major depressive disorder, single episode, moderate: Secondary | ICD-10-CM | POA: Diagnosis not present

## 2020-10-12 DIAGNOSIS — G5603 Carpal tunnel syndrome, bilateral upper limbs: Secondary | ICD-10-CM | POA: Diagnosis not present

## 2020-10-13 DIAGNOSIS — F321 Major depressive disorder, single episode, moderate: Secondary | ICD-10-CM | POA: Diagnosis not present

## 2020-10-14 DIAGNOSIS — F321 Major depressive disorder, single episode, moderate: Secondary | ICD-10-CM | POA: Diagnosis not present

## 2020-10-15 DIAGNOSIS — F321 Major depressive disorder, single episode, moderate: Secondary | ICD-10-CM | POA: Diagnosis not present

## 2020-10-16 DIAGNOSIS — F321 Major depressive disorder, single episode, moderate: Secondary | ICD-10-CM | POA: Diagnosis not present

## 2020-10-17 DIAGNOSIS — F321 Major depressive disorder, single episode, moderate: Secondary | ICD-10-CM | POA: Diagnosis not present

## 2020-10-18 DIAGNOSIS — F321 Major depressive disorder, single episode, moderate: Secondary | ICD-10-CM | POA: Diagnosis not present

## 2020-10-20 DIAGNOSIS — F321 Major depressive disorder, single episode, moderate: Secondary | ICD-10-CM | POA: Diagnosis not present

## 2020-10-21 DIAGNOSIS — F321 Major depressive disorder, single episode, moderate: Secondary | ICD-10-CM | POA: Diagnosis not present

## 2020-10-24 DIAGNOSIS — F321 Major depressive disorder, single episode, moderate: Secondary | ICD-10-CM | POA: Diagnosis not present

## 2020-10-25 DIAGNOSIS — F321 Major depressive disorder, single episode, moderate: Secondary | ICD-10-CM | POA: Diagnosis not present

## 2020-10-26 DIAGNOSIS — F321 Major depressive disorder, single episode, moderate: Secondary | ICD-10-CM | POA: Diagnosis not present

## 2020-10-27 ENCOUNTER — Encounter: Payer: Medicaid Other | Admitting: Internal Medicine

## 2020-10-27 DIAGNOSIS — F321 Major depressive disorder, single episode, moderate: Secondary | ICD-10-CM | POA: Diagnosis not present

## 2020-10-28 DIAGNOSIS — F321 Major depressive disorder, single episode, moderate: Secondary | ICD-10-CM | POA: Diagnosis not present

## 2020-10-29 DIAGNOSIS — F321 Major depressive disorder, single episode, moderate: Secondary | ICD-10-CM | POA: Diagnosis not present

## 2020-10-30 DIAGNOSIS — F321 Major depressive disorder, single episode, moderate: Secondary | ICD-10-CM | POA: Diagnosis not present

## 2020-10-31 DIAGNOSIS — F321 Major depressive disorder, single episode, moderate: Secondary | ICD-10-CM | POA: Diagnosis not present

## 2020-11-01 DIAGNOSIS — F321 Major depressive disorder, single episode, moderate: Secondary | ICD-10-CM | POA: Diagnosis not present

## 2020-11-02 DIAGNOSIS — F321 Major depressive disorder, single episode, moderate: Secondary | ICD-10-CM | POA: Diagnosis not present

## 2020-11-03 DIAGNOSIS — F321 Major depressive disorder, single episode, moderate: Secondary | ICD-10-CM | POA: Diagnosis not present

## 2020-11-04 DIAGNOSIS — F321 Major depressive disorder, single episode, moderate: Secondary | ICD-10-CM | POA: Diagnosis not present

## 2020-11-05 DIAGNOSIS — F321 Major depressive disorder, single episode, moderate: Secondary | ICD-10-CM | POA: Diagnosis not present

## 2020-11-06 DIAGNOSIS — F321 Major depressive disorder, single episode, moderate: Secondary | ICD-10-CM | POA: Diagnosis not present

## 2020-11-07 DIAGNOSIS — F321 Major depressive disorder, single episode, moderate: Secondary | ICD-10-CM | POA: Diagnosis not present

## 2020-11-08 DIAGNOSIS — F321 Major depressive disorder, single episode, moderate: Secondary | ICD-10-CM | POA: Diagnosis not present

## 2020-11-09 DIAGNOSIS — F321 Major depressive disorder, single episode, moderate: Secondary | ICD-10-CM | POA: Diagnosis not present

## 2020-11-10 DIAGNOSIS — F321 Major depressive disorder, single episode, moderate: Secondary | ICD-10-CM | POA: Diagnosis not present

## 2020-11-11 DIAGNOSIS — F321 Major depressive disorder, single episode, moderate: Secondary | ICD-10-CM | POA: Diagnosis not present

## 2020-11-11 DIAGNOSIS — R202 Paresthesia of skin: Secondary | ICD-10-CM | POA: Diagnosis not present

## 2020-11-12 DIAGNOSIS — F321 Major depressive disorder, single episode, moderate: Secondary | ICD-10-CM | POA: Diagnosis not present

## 2020-11-13 DIAGNOSIS — F321 Major depressive disorder, single episode, moderate: Secondary | ICD-10-CM | POA: Diagnosis not present

## 2020-11-14 DIAGNOSIS — F321 Major depressive disorder, single episode, moderate: Secondary | ICD-10-CM | POA: Diagnosis not present

## 2020-11-15 DIAGNOSIS — F321 Major depressive disorder, single episode, moderate: Secondary | ICD-10-CM | POA: Diagnosis not present

## 2020-11-16 DIAGNOSIS — F321 Major depressive disorder, single episode, moderate: Secondary | ICD-10-CM | POA: Diagnosis not present

## 2020-11-17 DIAGNOSIS — F321 Major depressive disorder, single episode, moderate: Secondary | ICD-10-CM | POA: Diagnosis not present

## 2020-11-18 DIAGNOSIS — M65311 Trigger thumb, right thumb: Secondary | ICD-10-CM | POA: Diagnosis not present

## 2020-11-18 DIAGNOSIS — F321 Major depressive disorder, single episode, moderate: Secondary | ICD-10-CM | POA: Diagnosis not present

## 2020-11-19 DIAGNOSIS — F321 Major depressive disorder, single episode, moderate: Secondary | ICD-10-CM | POA: Diagnosis not present

## 2020-11-20 DIAGNOSIS — F321 Major depressive disorder, single episode, moderate: Secondary | ICD-10-CM | POA: Diagnosis not present

## 2020-11-21 DIAGNOSIS — F321 Major depressive disorder, single episode, moderate: Secondary | ICD-10-CM | POA: Diagnosis not present

## 2020-11-22 DIAGNOSIS — F321 Major depressive disorder, single episode, moderate: Secondary | ICD-10-CM | POA: Diagnosis not present

## 2020-11-23 DIAGNOSIS — F321 Major depressive disorder, single episode, moderate: Secondary | ICD-10-CM | POA: Diagnosis not present

## 2020-11-23 DIAGNOSIS — M65311 Trigger thumb, right thumb: Secondary | ICD-10-CM | POA: Diagnosis not present

## 2020-11-24 DIAGNOSIS — F321 Major depressive disorder, single episode, moderate: Secondary | ICD-10-CM | POA: Diagnosis not present

## 2020-11-25 DIAGNOSIS — F321 Major depressive disorder, single episode, moderate: Secondary | ICD-10-CM | POA: Diagnosis not present

## 2020-11-28 DIAGNOSIS — F321 Major depressive disorder, single episode, moderate: Secondary | ICD-10-CM | POA: Diagnosis not present

## 2020-11-29 DIAGNOSIS — F321 Major depressive disorder, single episode, moderate: Secondary | ICD-10-CM | POA: Diagnosis not present

## 2021-01-20 DIAGNOSIS — F321 Major depressive disorder, single episode, moderate: Secondary | ICD-10-CM | POA: Diagnosis not present

## 2021-01-23 DIAGNOSIS — F321 Major depressive disorder, single episode, moderate: Secondary | ICD-10-CM | POA: Diagnosis not present

## 2021-01-24 DIAGNOSIS — F321 Major depressive disorder, single episode, moderate: Secondary | ICD-10-CM | POA: Diagnosis not present

## 2021-01-25 ENCOUNTER — Other Ambulatory Visit: Payer: Self-pay

## 2021-01-25 ENCOUNTER — Emergency Department
Admission: EM | Admit: 2021-01-25 | Discharge: 2021-01-25 | Disposition: A | Discharge status: Home / Self Care | Payer: Medicaid Other | Attending: Emergency Medicine | Admitting: Emergency Medicine

## 2021-01-25 ENCOUNTER — Emergency Department: Payer: Medicaid Other

## 2021-01-25 DIAGNOSIS — M24151 Other articular cartilage disorders, right hip: Secondary | ICD-10-CM | POA: Diagnosis not present

## 2021-01-25 DIAGNOSIS — R1030 Lower abdominal pain, unspecified: Secondary | ICD-10-CM | POA: Diagnosis not present

## 2021-01-25 DIAGNOSIS — Z87891 Personal history of nicotine dependence: Secondary | ICD-10-CM | POA: Insufficient documentation

## 2021-01-25 DIAGNOSIS — S73191A Other sprain of right hip, initial encounter: Secondary | ICD-10-CM | POA: Diagnosis not present

## 2021-01-25 DIAGNOSIS — X58XXXA Exposure to other specified factors, initial encounter: Secondary | ICD-10-CM | POA: Insufficient documentation

## 2021-01-25 DIAGNOSIS — D259 Leiomyoma of uterus, unspecified: Secondary | ICD-10-CM | POA: Diagnosis not present

## 2021-01-25 DIAGNOSIS — F321 Major depressive disorder, single episode, moderate: Secondary | ICD-10-CM | POA: Diagnosis not present

## 2021-01-25 DIAGNOSIS — S79911A Unspecified injury of right hip, initial encounter: Secondary | ICD-10-CM | POA: Diagnosis present

## 2021-01-25 DIAGNOSIS — M1611 Unilateral primary osteoarthritis, right hip: Secondary | ICD-10-CM | POA: Diagnosis not present

## 2021-01-25 DIAGNOSIS — N83201 Unspecified ovarian cyst, right side: Secondary | ICD-10-CM | POA: Diagnosis not present

## 2021-01-25 LAB — CBC
HCT: 34.3 % — ABNORMAL LOW (ref 36.0–46.0)
Hemoglobin: 10.5 g/dL — ABNORMAL LOW (ref 12.0–15.0)
MCH: 21.4 pg — ABNORMAL LOW (ref 26.0–34.0)
MCHC: 30.6 g/dL (ref 30.0–36.0)
MCV: 69.9 fL — ABNORMAL LOW (ref 80.0–100.0)
Platelets: 285 10*3/uL (ref 150–400)
RBC: 4.91 MIL/uL (ref 3.87–5.11)
RDW: 16.9 % — ABNORMAL HIGH (ref 11.5–15.5)
WBC: 6.1 10*3/uL (ref 4.0–10.5)
nRBC: 0 % (ref 0.0–0.2)

## 2021-01-25 LAB — COMPREHENSIVE METABOLIC PANEL
ALT: 10 U/L (ref 0–44)
AST: 14 U/L — ABNORMAL LOW (ref 15–41)
Albumin: 3.6 g/dL (ref 3.5–5.0)
Alkaline Phosphatase: 61 U/L (ref 38–126)
Anion gap: 10 (ref 5–15)
BUN: 18 mg/dL (ref 6–20)
CO2: 26 mmol/L (ref 22–32)
Calcium: 9 mg/dL (ref 8.9–10.3)
Chloride: 100 mmol/L (ref 98–111)
Creatinine, Ser: 0.87 mg/dL (ref 0.44–1.00)
GFR, Estimated: >60 mL/min (ref >60)
Glucose, Bld: 144 mg/dL — ABNORMAL HIGH (ref 70–99)
Potassium: 3.3 mmol/L — ABNORMAL LOW (ref 3.5–5.1)
Sodium: 136 mmol/L (ref 135–145)
Total Bilirubin: 0.5 mg/dL (ref 0.3–1.2)
Total Protein: 7.1 g/dL (ref 6.5–8.1)

## 2021-01-25 LAB — URINALYSIS, COMPLETE (UACMP) WITH MICROSCOPIC
Bilirubin Urine: NEGATIVE
Glucose, UA: NEGATIVE mg/dL
Ketones, ur: NEGATIVE mg/dL
Leukocytes,Ua: NEGATIVE
Nitrite: NEGATIVE
Protein, ur: NEGATIVE mg/dL
RBC / HPF: >50 RBC/hpf (ref 0–5)
Specific Gravity, Urine: 1.02 (ref 1.005–1.030)
pH: 7 (ref 5.0–8.0)

## 2021-01-25 LAB — POC URINE PREG, ED: Preg Test, Ur: NEGATIVE

## 2021-01-25 LAB — LIPASE, BLOOD: Lipase: 27 U/L (ref 11–51)

## 2021-01-25 MED ORDER — NAPROXEN 500 MG PO TBEC
500.0000 mg | DELAYED_RELEASE_TABLET | Freq: Two times a day (BID) | ORAL | 2 refills | Status: AC
Start: 2021-01-25 — End: 2022-01-25

## 2021-01-25 MED ORDER — HYDROCODONE-ACETAMINOPHEN 5-325 MG PO TABS: 1.0000 | ORAL_TABLET | Freq: Four times a day (QID) | ORAL | 0 refills | Status: DC | PRN

## 2021-01-25 NOTE — ED Triage Notes (Signed)
Pt c/o right hip and RLQ pain for the past week, saays she was seen by ortho last week and put prednisone and pain meds with no relief and feels like she is having more pain into the abd now. Denies N/V/D/fever

## 2021-01-25 NOTE — ED Provider Notes (Addendum)
[facility] Emergency Department Provider Note   ____________________________________________   Event Date/Time   First MD Initiated Contact with Patient 01/25/21 [phone]     (approximate)  I have reviewed the triage vital signs and the nursing notes.   HISTORY  Chief Complaint Groin Pain    HPI Meagan Medina is a 40 y.o. female who reports right sided groin pain going on for about a week.  Patient had a hip x-ray at orthopedics and was given steroids diagnosed with bursitis but it has not gotten any better.  In fact it may have gotten slightly worse.  She is only able to limp a little bit and cannot go up stairs.  She denies any known injury.  She is not running a fever, having any vomiting or any other complaints.         Past Medical History:  Diagnosis Date   Abnormal Pap smear of cervix    Depression    GERD (gastroesophageal reflux disease)     Patient Active Problem List   Diagnosis Date Noted   Encounter to establish care with new doctor 05/27/2020   Acute otitis externa of left ear 05/27/2020   Gastroesophageal reflux disease 05/27/2020   Need for Tdap vaccination 05/27/2020   Breast mass, right 08/25/2019    Past Surgical History:  Procedure Laterality Date   CESAREAN SECTION  07/07/2013   WRIST SURGERY Left 1993   1991, 1992    Prior to Admission medications   Medication Sig Start Date End Date Taking? Authorizing Provider  HYDROcodone-acetaminophen (NORCO/VICODIN) 5-325 MG tablet Take 1 tablet by mouth every 6 (six) hours as needed for moderate pain. 01/25/21  Yes Arnaldo Natal, MD  naproxen (EC NAPROSYN) 500 MG EC tablet Take 1 tablet (500 mg total) by mouth 2 (two) times daily with a meal. 01/25/21 01/25/22 Yes Arnaldo Natal, MD  famotidine (PEPCID) 10 MG tablet Take 1 tablet (10 mg total) by mouth 2 (two) times daily. 05/27/20   Malfi, Jodelle Gross, FNP  neomycin-polymyxin-hydrocortisone (CORTISPORIN) OTIC solution Place 4 drops  into the left ear 4 (four) times daily. 05/27/20   Malfi, Jodelle Gross, FNP    Allergies Ibuprofen  Family History  Problem Relation Age of Onset   Breast cancer Neg Hx     Social History Social History   Tobacco Use   Smoking status: Former    Years: 1.00    Types: Cigarettes    Quit date: 2003    Years since quitting: 19.6   Smokeless tobacco: Never  Vaping Use   Vaping Use: Never used  Substance Use Topics   Alcohol use: Not Currently   Drug use: Never    Review of Systems  Constitutional: No fever/chills Eyes: No visual changes. ENT: No sore throat. Cardiovascular: Denies chest pain. Respiratory: Denies shortness of breath. Gastrointestinal: No abdominal pain.  No nausea, no vomiting.  No diarrhea.  No constipation. Genitourinary: Negative for dysuria. Musculoskeletal: Negative for back pain. Skin: Negative for rash. Neurological: Negative for headaches, focal weakness   ____________________________________________   PHYSICAL EXAM:  VITAL SIGNS: ED Triage Vitals  Enc Vitals Group     BP 01/25/21 0811 124/90     Pulse Rate 01/25/21 0811 81     Resp 01/25/21 0811 17     Temp 01/25/21 0811 98.2 F (36.8 C)     Temp Source 01/25/21 0811 Oral     SpO2 01/25/21 0811 99 %     Weight  01/25/21 0816 160 lb (72.6 kg)     Height 01/25/21 0816 5\' 5"  (1.651 m)     Head Circumference --      Peak Flow --      Pain Score 01/25/21 0816 10     Pain Loc --      Pain Edu? --      Excl. in GC? --     Constitutional: Alert and oriented.  Lying on her left side looks uncomfortable Eyes: Conjunctivae are normal.  Head: Atraumatic. Nose: No congestion/rhinnorhea. Mouth/Throat: Mucous membranes are moist.  Oropharynx non-erythematous. Neck: No stridor.  Cardiovascular: Normal rate, regular rhythm. Grossly normal heart sounds.  Good peripheral circulation. Respiratory: Normal respiratory effort.  No retractions. Lungs CTAB. Gastrointestinal: Soft and nontender to deep  palpation or percussion. No distention. No abdominal bruits.  Musculoskeletal: No lower extremity tenderness nor edema.  Patient is tender in the right groin in the mid part of the right groin.   really superior pubic ramus.  I do not palpate any lymph nodes there.  There is no pain above or below that area. Neurologic:  Normal speech and language. No gross focal neurologic deficits are appreciated.  Pain is making it very difficult for her to lift her right leg off the bed at all. Skin:  Skin is warm, dry and intact. No rash noted.   ____________________________________________   LABS (all labs ordered are listed, but only abnormal results are displayed)  Labs Reviewed  COMPREHENSIVE METABOLIC PANEL - Abnormal; Notable for the following components:      Result Value   Potassium 3.3 (*)    Glucose, Bld 144 (*)    AST 14 (*)    All other components within normal limits  CBC - Abnormal; Notable for the following components:   Hemoglobin 10.5 (*)    HCT 34.3 (*)    MCV 69.9 (*)    MCH 21.4 (*)    RDW 16.9 (*)    All other components within normal limits  URINALYSIS, COMPLETE (UACMP) WITH MICROSCOPIC - Abnormal; Notable for the following components:   Color, Urine ORANGE (*)    Hgb urine dipstick LARGE (*)    Bacteria, UA RARE (*)    All other components within normal limits  LIPASE, BLOOD  POC URINE PREG, ED   ____________________________________________  EKG   ____________________________________________  RADIOLOGY 03/27/21, personally viewed and evaluated these images (plain radiographs) as part of my medical decision making, as well as reviewing the written report by the radiologist.  ED MD interpretation: MRI done read by radiology reviewed by me shows a tear of the right anterior superior labrum in the hip.  This is likely the cause of the patient's pain.  Official radiology report(s): MR PELVIS WO CONTRAST  Result Date: 01/25/2021 CLINICAL DATA:  Right  groin pain for the past week.  No injury. EXAM: MRI PELVIS WITHOUT CONTRAST TECHNIQUE: Multiplanar multisequence MR imaging of the pelvis was performed. No intravenous contrast was administered. COMPARISON:  None. FINDINGS: Bones: There is no evidence of acute fracture, dislocation or avascular necrosis. No focal bone lesion. The visualized sacroiliac joints and symphysis pubis appear normal. Articular cartilage and labrum Articular cartilage: Partial-thickness cartilage loss in the right anterior superior hip joint with prominent subchondral cystic change in the anterior superior acetabulum. Labrum: Right anterior superior labral tear. No paralabral abnormality. Joint or bursal effusion Joint effusion: No significant hip joint effusion. Bursae: No focal periarticular fluid collection. Muscles and tendons  Muscles and tendons: The visualized gluteus, hamstring and iliopsoas tendons appear normal. No muscle edema or atrophy. Other findings Miscellaneous: Multiple uterine fibroids measuring up to 3.8 cm. Trace free fluid in the pelvis, likely physiologic. IMPRESSION: 1. No acute abnormality.  Mild right hip osteoarthritis. 2. Right anterior superior labral tear. 3. Fibroid uterus. Electronically Signed   By: Obie Dredge M.D.   On: 01/25/2021 11:13    ____________________________________________   PROCEDURES  Procedure(s) performed (including Critical Care):  Procedures   ____________________________________________   INITIAL IMPRESSION / ASSESSMENT AND PLAN / ED COURSE       Discussed patient with orthopedics.  They recommend steroids but the patient is already had that stable try their second recommendation which is nonsteroidals.  Additionally I will give her just a little bit of pain medicine for the first couple days and some crutches to help her get around.  She will return if she is worse and she will follow-up with orthopedics.          ____________________________________________   FINAL CLINICAL IMPRESSION(S) / ED DIAGNOSES  Final diagnoses:  Tear of right acetabular labrum, initial encounter     ED Discharge Orders          Ordered    HYDROcodone-acetaminophen (NORCO/VICODIN) 5-325 MG tablet  Every 6 hours PRN        01/25/21 1315    naproxen (EC NAPROSYN) 500 MG EC tablet  2 times daily with meals        01/25/21 1315             Note:  This document was prepared using Dragon voice recognition software and may include unintentional dictation errors.    Arnaldo Natal, MD 01/25/21 1319 I should add that we had been paging Dr. Hyacinth Meeker to consult with him about the labral tear and he was not answering and when we called [facility] it develops that he is on the call schedule erroneously as he is actually on vacation.  We were then able to get someone else from [facility] to consult with.  But that delayed the patient's discharge by about 2 hours.   Arnaldo Natal, MD 01/25/21 1325

## 2021-01-25 NOTE — ED Notes (Signed)
Pt transported to MRI 

## 2021-01-25 NOTE — Discharge Instructions (Addendum)
You have a tear in some of the cartilage in the hip.  This is probably what is causing the pain.  Use the crutches for the next several days to help bear weight on that hip.  Take the Vicodin 1 pill 4 times a day as needed for severe pain.  When that runs out you can try the Naprosyn 1 twice a day with food to help protect your stomach because it can upset it like aspirin or Motrin.  Follow-up with your orthopedic doctors.  Give them a call if they do not call you in the next day or 2 to set up a follow-up appointment sometime in the next week or so.  Please return for increasing pain or any other problems.

## 2021-01-25 NOTE — ED Triage Notes (Signed)
Pt presents ED with c/o of R sided groin pain that has been ongoing for 1 week. Pt states went to ortho had a R hip XRAY and was given steroids and Dx'ed bursitis. Pt denies fevers or chills. Pt able to ambulate but states "I can only limp". NAD noted. Pt denies injury or trauma to that area. No bruising or swelling noted to R groin.

## 2021-01-26 ENCOUNTER — Telehealth: Payer: Self-pay

## 2021-01-26 DIAGNOSIS — F321 Major depressive disorder, single episode, moderate: Secondary | ICD-10-CM | POA: Diagnosis not present

## 2021-01-26 NOTE — Telephone Encounter (Signed)
Transition Care Management Follow-up Telephone Call Date of discharge and from where: 01/25/2021 from [facility] How have you been since you were released from the hospital? Pt stated that she is feeling much better today.  Any questions or concerns? No  Items Reviewed: Did the pt receive and understand the discharge instructions provided? Yes  Medications obtained and verified? Yes  Other? No  Any new allergies since your discharge? No  Dietary orders reviewed? No Do you have support at home? Yes   Functional Questionnaire: (I = Independent and D = Dependent) ADLs: I  Bathing/Dressing- I  Meal Prep- I  Eating- I  Maintaining continence- I  Transferring/Ambulation- I  Managing Meds- I   Follow up appointments reviewed:  PCP Hospital f/u appt confirmed? No   Specialist Hospital f/u appt confirmed? No   Are transportation arrangements needed? No  If their condition worsens, is the pt aware to call PCP or go to the Emergency Dept.? Yes Was the patient provided with contact information for the PCP's office or ED? Yes Was to pt encouraged to call back with questions or concerns? Yes

## 2021-01-27 DIAGNOSIS — F321 Major depressive disorder, single episode, moderate: Secondary | ICD-10-CM | POA: Diagnosis not present

## 2021-01-28 DIAGNOSIS — M24159 Other articular cartilage disorders, unspecified hip: Secondary | ICD-10-CM | POA: Diagnosis not present

## 2021-01-30 DIAGNOSIS — F321 Major depressive disorder, single episode, moderate: Secondary | ICD-10-CM | POA: Diagnosis not present

## 2021-01-31 DIAGNOSIS — F321 Major depressive disorder, single episode, moderate: Secondary | ICD-10-CM | POA: Diagnosis not present

## 2021-02-01 DIAGNOSIS — F321 Major depressive disorder, single episode, moderate: Secondary | ICD-10-CM | POA: Diagnosis not present

## 2021-02-02 DIAGNOSIS — F321 Major depressive disorder, single episode, moderate: Secondary | ICD-10-CM | POA: Diagnosis not present

## 2021-02-03 DIAGNOSIS — F321 Major depressive disorder, single episode, moderate: Secondary | ICD-10-CM | POA: Diagnosis not present

## 2021-02-05 DIAGNOSIS — F321 Major depressive disorder, single episode, moderate: Secondary | ICD-10-CM | POA: Diagnosis not present

## 2021-02-06 DIAGNOSIS — F321 Major depressive disorder, single episode, moderate: Secondary | ICD-10-CM | POA: Diagnosis not present

## 2021-02-07 DIAGNOSIS — F321 Major depressive disorder, single episode, moderate: Secondary | ICD-10-CM | POA: Diagnosis not present

## 2021-02-08 DIAGNOSIS — F321 Major depressive disorder, single episode, moderate: Secondary | ICD-10-CM | POA: Diagnosis not present

## 2021-02-10 DIAGNOSIS — F321 Major depressive disorder, single episode, moderate: Secondary | ICD-10-CM | POA: Diagnosis not present

## 2021-02-13 DIAGNOSIS — F321 Major depressive disorder, single episode, moderate: Secondary | ICD-10-CM | POA: Diagnosis not present

## 2021-02-14 DIAGNOSIS — F321 Major depressive disorder, single episode, moderate: Secondary | ICD-10-CM | POA: Diagnosis not present

## 2021-02-15 DIAGNOSIS — F321 Major depressive disorder, single episode, moderate: Secondary | ICD-10-CM | POA: Diagnosis not present

## 2021-02-16 DIAGNOSIS — M24159 Other articular cartilage disorders, unspecified hip: Secondary | ICD-10-CM | POA: Diagnosis not present

## 2021-02-16 DIAGNOSIS — F321 Major depressive disorder, single episode, moderate: Secondary | ICD-10-CM | POA: Diagnosis not present

## 2021-02-16 DIAGNOSIS — M24151 Other articular cartilage disorders, right hip: Secondary | ICD-10-CM | POA: Diagnosis not present

## 2021-02-17 DIAGNOSIS — F321 Major depressive disorder, single episode, moderate: Secondary | ICD-10-CM | POA: Diagnosis not present

## 2021-02-20 DIAGNOSIS — F321 Major depressive disorder, single episode, moderate: Secondary | ICD-10-CM | POA: Diagnosis not present

## 2021-02-21 DIAGNOSIS — F321 Major depressive disorder, single episode, moderate: Secondary | ICD-10-CM | POA: Diagnosis not present

## 2021-02-22 DIAGNOSIS — F321 Major depressive disorder, single episode, moderate: Secondary | ICD-10-CM | POA: Diagnosis not present

## 2021-02-23 DIAGNOSIS — F321 Major depressive disorder, single episode, moderate: Secondary | ICD-10-CM | POA: Diagnosis not present

## 2021-02-24 DIAGNOSIS — F321 Major depressive disorder, single episode, moderate: Secondary | ICD-10-CM | POA: Diagnosis not present

## 2021-02-24 DIAGNOSIS — M24159 Other articular cartilage disorders, unspecified hip: Secondary | ICD-10-CM | POA: Diagnosis not present

## 2021-02-24 DIAGNOSIS — M24151 Other articular cartilage disorders, right hip: Secondary | ICD-10-CM | POA: Diagnosis not present

## 2021-02-27 DIAGNOSIS — F321 Major depressive disorder, single episode, moderate: Secondary | ICD-10-CM | POA: Diagnosis not present

## 2021-02-28 DIAGNOSIS — F321 Major depressive disorder, single episode, moderate: Secondary | ICD-10-CM | POA: Diagnosis not present

## 2021-03-01 DIAGNOSIS — F321 Major depressive disorder, single episode, moderate: Secondary | ICD-10-CM | POA: Diagnosis not present

## 2021-03-02 DIAGNOSIS — F321 Major depressive disorder, single episode, moderate: Secondary | ICD-10-CM | POA: Diagnosis not present

## 2021-03-03 DIAGNOSIS — F321 Major depressive disorder, single episode, moderate: Secondary | ICD-10-CM | POA: Diagnosis not present

## 2021-03-06 DIAGNOSIS — F321 Major depressive disorder, single episode, moderate: Secondary | ICD-10-CM | POA: Diagnosis not present

## 2021-03-07 DIAGNOSIS — F321 Major depressive disorder, single episode, moderate: Secondary | ICD-10-CM | POA: Diagnosis not present

## 2021-03-08 DIAGNOSIS — F321 Major depressive disorder, single episode, moderate: Secondary | ICD-10-CM | POA: Diagnosis not present

## 2021-03-09 DIAGNOSIS — F321 Major depressive disorder, single episode, moderate: Secondary | ICD-10-CM | POA: Diagnosis not present

## 2021-03-10 ENCOUNTER — Encounter: Payer: Medicaid Other | Admitting: Internal Medicine

## 2021-03-10 DIAGNOSIS — F321 Major depressive disorder, single episode, moderate: Secondary | ICD-10-CM | POA: Diagnosis not present

## 2021-03-13 DIAGNOSIS — F321 Major depressive disorder, single episode, moderate: Secondary | ICD-10-CM | POA: Diagnosis not present

## 2021-03-14 DIAGNOSIS — F321 Major depressive disorder, single episode, moderate: Secondary | ICD-10-CM | POA: Diagnosis not present

## 2021-03-15 DIAGNOSIS — F321 Major depressive disorder, single episode, moderate: Secondary | ICD-10-CM | POA: Diagnosis not present

## 2021-03-16 ENCOUNTER — Ambulatory Visit (INDEPENDENT_AMBULATORY_CARE_PROVIDER_SITE_OTHER): Payer: Medicaid Other | Admitting: Internal Medicine

## 2021-03-16 ENCOUNTER — Other Ambulatory Visit: Payer: Self-pay

## 2021-03-16 ENCOUNTER — Encounter: Payer: Self-pay | Admitting: Internal Medicine

## 2021-03-16 VITALS — BP 106/72 | HR 88 | Temp 97.7°F | Resp 17 | Ht 65.0 in | Wt 196.0 lb

## 2021-03-16 DIAGNOSIS — Z6832 Body mass index (BMI) 32.0-32.9, adult: Secondary | ICD-10-CM

## 2021-03-16 DIAGNOSIS — D649 Anemia, unspecified: Secondary | ICD-10-CM | POA: Insufficient documentation

## 2021-03-16 DIAGNOSIS — Z1159 Encounter for screening for other viral diseases: Secondary | ICD-10-CM | POA: Diagnosis not present

## 2021-03-16 DIAGNOSIS — E6609 Other obesity due to excess calories: Secondary | ICD-10-CM | POA: Insufficient documentation

## 2021-03-16 DIAGNOSIS — K219 Gastro-esophageal reflux disease without esophagitis: Secondary | ICD-10-CM | POA: Diagnosis not present

## 2021-03-16 DIAGNOSIS — E66811 Obesity, class 1: Secondary | ICD-10-CM

## 2021-03-16 DIAGNOSIS — F321 Major depressive disorder, single episode, moderate: Secondary | ICD-10-CM | POA: Diagnosis not present

## 2021-03-16 DIAGNOSIS — M1651 Unilateral post-traumatic osteoarthritis, right hip: Secondary | ICD-10-CM

## 2021-03-16 DIAGNOSIS — M1990 Unspecified osteoarthritis, unspecified site: Secondary | ICD-10-CM | POA: Insufficient documentation

## 2021-03-16 DIAGNOSIS — Z114 Encounter for screening for human immunodeficiency virus [HIV]: Secondary | ICD-10-CM | POA: Diagnosis not present

## 2021-03-16 DIAGNOSIS — D500 Iron deficiency anemia secondary to blood loss (chronic): Secondary | ICD-10-CM

## 2021-03-16 DIAGNOSIS — Z0001 Encounter for general adult medical examination with abnormal findings: Secondary | ICD-10-CM

## 2021-03-16 DIAGNOSIS — R7303 Prediabetes: Secondary | ICD-10-CM | POA: Diagnosis not present

## 2021-03-16 NOTE — Assessment & Plan Note (Signed)
Following with [facility]

## 2021-03-16 NOTE — Assessment & Plan Note (Signed)
Encouraged diet and exercise for weight loss ?

## 2021-03-16 NOTE — Assessment & Plan Note (Signed)
A1C today Encouraged low carb diet and exercise for weight loss 

## 2021-03-16 NOTE — Assessment & Plan Note (Signed)
Avoid foods that trigger your reflux Encouraged weight loss as this can help reduce reflux symptoms Continue Famotidine

## 2021-03-16 NOTE — Assessment & Plan Note (Signed)
CBC today Consider oral iron if she continues to have a drop in her Hemaglobin/Hematocrit

## 2021-03-16 NOTE — Progress Notes (Signed)
Subjective:    Patient ID: Meagan Medina, female    DOB: 08-Apr-1981, 40 y.o.   MRN: [id number]  HPI  Patient presents the clinic today for her annual exam.  She is also due to follow-up chronic conditions.  GERD: Triggered by caffeine and spicy foods.  She denies breakthrough on Famotidine.  There is no upper GI.  Prediabetes: Her last A1c was 6.1%, 08/2019.  She is not taking any oral diabetic medication at this time.  She does not check her sugars.  Anemia: Her last H/H was 10.5/34.3, 01/2021.  She is not taking any oral iron at this time.  She does not follow with hematology.  OA/Labral Tear of Right Hip: She has gotten a steroid injection with minimal relief of symptoms. She is following with [facility].  Flu: Never Tetanus: 05/2020 COVID: Never Pap smear: 2021, [place]: Never Vision screening: as needed Dentist: as needed  Diet: She does eat meat. She consumes fruits and veggies. She tries to avoid fried foods. She drinks mostly water, gatorade or tea. Exercise: None  Review of Systems     Past Medical History:  Diagnosis Date   Abnormal Pap smear of cervix    Depression    GERD (gastroesophageal reflux disease)     Current Outpatient Medications  Medication Sig Dispense Refill   famotidine (PEPCID) 10 MG tablet Take 1 tablet (10 mg total) by mouth 2 (two) times daily. 180 tablet 1   HYDROcodone-acetaminophen (NORCO/VICODIN) 5-325 MG tablet Take 1 tablet by mouth every 6 (six) hours as needed for moderate pain. 15 tablet 0   naproxen (EC NAPROSYN) 500 MG EC tablet Take 1 tablet (500 mg total) by mouth 2 (two) times daily with a meal. 60 tablet 2   neomycin-polymyxin-hydrocortisone (CORTISPORIN) OTIC solution Place 4 drops into the left ear 4 (four) times daily. 10 mL 0   No current facility-administered medications for this visit.    Allergies  Allergen Reactions   Ibuprofen Nausea And Vomiting and Other (See Comments)     Family History  Problem Relation Age of Onset   Breast cancer Neg Hx     Social History   Socioeconomic History   Marital status: Single    Spouse name: Not on file   Number of children: Not on file   Years of education: Not on file   Highest education level: Not on file  Occupational History   Not on file  Tobacco Use   Smoking status: Former    Years: 1.00    Types: Cigarettes    Quit date: 2003    Years since quitting: 19.8   Smokeless tobacco: Never  Vaping Use   Vaping Use: Never used  Substance and Sexual Activity   Alcohol use: Not Currently   Drug use: Never   Sexual activity: Yes    Partners: Male    Birth control/protection: Condom  Other Topics Concern   Not on file  Social History Narrative   Not on file   Social Determinants of Health   Financial Resource Strain: Not on file  Food Insecurity: Not on file  Transportation Needs: Not on file  Physical Activity: Not on file  Stress: Not on file  Social Connections: Not on file  Intimate Partner Violence: Not on file     Constitutional: Denies fever, malaise, fatigue, headache or abrupt weight changes.  HEENT: Denies eye pain, eye redness, ear pain, ringing in the ears, wax buildup, runny nose,  nasal congestion, bloody nose, or sore throat. Respiratory: Denies difficulty breathing, shortness of breath, cough or sputum production.   Cardiovascular: Denies chest pain, chest tightness, palpitations or swelling in the hands or feet.  Gastrointestinal: Denies abdominal pain, bloating, constipation, diarrhea or blood in the stool.  GU: Denies urgency, frequency, pain with urination, burning sensation, blood in urine, odor or discharge. Musculoskeletal: Pt reports right hip pain. Denies decrease in range of motion, difficulty with gait, muscle pain or joint swelling.  Skin: Denies redness, rashes, lesions or ulcercations.  Neurological: Denies dizziness, difficulty with memory, difficulty with speech or  problems with balance and coordination.  Psych: Denies anxiety, depression, SI/HI.  No other specific complaints in a complete review of systems (except as listed in HPI above).  Objective:   Physical Exam BP 106/72 (BP Location: Right Arm, Patient Position: Sitting, Cuff Size: Normal)   Pulse 88   Temp 97.7 F (36.5 C) (Temporal)   Resp 17   Ht 5' 5" (1.651 m)   Wt 196 lb (88.9 kg)   LMP 03/09/2021 (Approximate)   SpO2 100%   BMI 32.62 kg/m   Wt Readings from Last 3 Encounters:  01/25/21 160 lb (72.6 kg)  05/27/20 203 lb (92.1 kg)  10/17/19 198 lb (89.8 kg)    General: Appears her stated age, obese, in NAD. Skin: Warm, dry and intact.  HEENT: Head: normal shape and size; Eyes: EOMs intact;  Neck:  Neck supple, trachea midline. No masses, lumps or thyromegaly present.  Cardiovascular: Normal rate and rhythm. S1,S2 noted.  No murmur, rubs or gallops noted. No JVD or BLE edema.  Pulmonary/Chest: Normal effort and positive vesicular breath sounds. No respiratory distress. No wheezes, rales or ronchi noted.  Abdomen: Soft and nontender. Normal bowel sounds. No distention or masses noted. Liver, spleen and kidneys non palpable. Musculoskeletal: Decreased passive abduction, adduction, internal and external rotation of the right hip secondary to pain. Strength 2/5 BUE/BLE. No difficulty with gait.  Neurological: Alert and oriented. Cranial nerves II-XII grossly intact. Coordination normal.  Psychiatric: Mood and affect normal. Behavior is normal. Judgment and thought content normal.    BMET    Component Value Date/Time   NA 136 01/25/2021 0809   NA 136 12/22/2012 1242   K 3.3 (L) 01/25/2021 0809   K 3.7 12/22/2012 1242   CL 100 01/25/2021 0809   CL 107 12/22/2012 1242   CO2 26 01/25/2021 0809   CO2 23 12/22/2012 1242   GLUCOSE 144 (H) 01/25/2021 0809   GLUCOSE 89 12/22/2012 1242   BUN 18 01/25/2021 0809   BUN 10 12/22/2012 1242   CREATININE 0.87 01/25/2021 0809    CREATININE 0.72 12/22/2012 1242   CALCIUM 9.0 01/25/2021 0809   CALCIUM 9.7 12/22/2012 1242   GFRNONAA >60 01/25/2021 0809   GFRNONAA >60 12/22/2012 1242   GFRAA >60 12/22/2012 1242    Lipid Panel  No results found for: CHOL, TRIG, HDL, CHOLHDL, VLDL, LDLCALC  CBC    Component Value Date/Time   WBC 6.1 01/25/2021 0809   RBC 4.91 01/25/2021 0809   HGB 10.5 (L) 01/25/2021 0809   HGB 11.4 (L) 07/04/2013 1120   HCT 34.3 (L) 01/25/2021 0809   HCT 32.1 (L) 07/08/2013 0616   PLT 285 01/25/2021 0809   PLT 264 07/04/2013 1120   MCV 69.9 (L) 01/25/2021 0809   MCV 74 (L) 07/04/2013 1120   MCH 21.4 (L) 01/25/2021 0809   MCHC 30.6 01/25/2021 0809   RDW 16.9 (H) 01/25/2021  0809   RDW 15.5 (H) 07/04/2013 1120   LYMPHSABS 1.0 07/04/2013 1120   MONOABS 0.6 07/04/2013 1120   EOSABS 0.0 07/04/2013 1120   BASOSABS 0.0 07/04/2013 1120    Hgb A1C Lab Results  Component Value Date   HGBA1C 6.1 (H) 08/25/2019           Assessment & Plan:   Preventative Health Maintenance:  She declines flu shot Tetanus UTD Encouraged her to get her COVID-vaccine Pap smear UTD, will request record We will start screening mammograms at 40 Encouraged her to consume a balanced diet and exercise regimen Advised her to see an eye doctor and dentist annually We will check CBC, c-Met, lipid, A1c, HIV and hep C today  RTC in 6 months follow-up prediabetes Webb Silversmith, NP This visit occurred during the SARS-CoV-2 public health emergency.  Safety protocols were in place, including screening questions prior to the visit, additional usage of staff PPE, and extensive cleaning of exam room while observing appropriate contact time as indicated for disinfecting solutions.

## 2021-03-17 DIAGNOSIS — F321 Major depressive disorder, single episode, moderate: Secondary | ICD-10-CM | POA: Diagnosis not present

## 2021-03-17 LAB — COMPLETE METABOLIC PANEL WITH GFR
AG Ratio: 1.4 (calc) (ref 1.0–2.5)
ALT: 7 U/L (ref 6–29)
AST: 11 U/L (ref 10–30)
Albumin: 4.1 g/dL (ref 3.6–5.1)
Alkaline phosphatase (APISO): 68 U/L (ref 31–125)
BUN: 17 mg/dL (ref 7–25)
CO2: 24 mmol/L (ref 20–32)
Calcium: 9.5 mg/dL (ref 8.6–10.2)
Chloride: 105 mmol/L (ref 98–110)
Creat: 0.88 mg/dL (ref 0.50–0.99)
Globulin: 3 g/dL (calc) (ref 1.9–3.7)
Glucose, Bld: 91 mg/dL (ref 65–99)
Potassium: 4.3 mmol/L (ref 3.5–5.3)
Sodium: 139 mmol/L (ref 135–146)
Total Bilirubin: 0.2 mg/dL (ref 0.2–1.2)
Total Protein: 7.1 g/dL (ref 6.1–8.1)
eGFR: 84 mL/min/{1.73_m2} (ref >=60)

## 2021-03-17 LAB — CBC
HCT: 36.3 % (ref 35.0–45.0)
Hemoglobin: 10.9 g/dL — ABNORMAL LOW (ref 11.7–15.5)
MCH: 21.6 pg — ABNORMAL LOW (ref 27.0–33.0)
MCHC: 30 g/dL — ABNORMAL LOW (ref 32.0–36.0)
MCV: 72 fL — ABNORMAL LOW (ref 80.0–100.0)
MPV: 11.2 fL (ref 7.5–12.5)
Platelets: 329 10*3/uL (ref 140–400)
RBC: 5.04 10*6/uL (ref 3.80–5.10)
RDW: 16.7 % — ABNORMAL HIGH (ref 11.0–15.0)
WBC: 5.3 10*3/uL (ref 3.8–10.8)

## 2021-03-17 LAB — LIPID PANEL
Cholesterol: 262 mg/dL — ABNORMAL HIGH (ref <200)
HDL: 51 mg/dL (ref >=50)
LDL Cholesterol (Calc): 181 mg/dL (calc) — ABNORMAL HIGH
Non-HDL Cholesterol (Calc): 211 mg/dL (calc) — ABNORMAL HIGH (ref <130)
Total CHOL/HDL Ratio: 5.1 (calc) — ABNORMAL HIGH (ref <5.0)
Triglycerides: 157 mg/dL — ABNORMAL HIGH (ref <150)

## 2021-03-17 LAB — HEPATITIS C ANTIBODY
Hepatitis C Ab: NONREACTIVE
SIGNAL TO CUT-OFF: 0.17 (ref <1.00)

## 2021-03-17 LAB — HIV ANTIBODY (ROUTINE TESTING W REFLEX): HIV 1&2 Ab, 4th Generation: NONREACTIVE

## 2021-03-17 LAB — HEMOGLOBIN A1C
Hgb A1c MFr Bld: 6.2 % of total Hgb — ABNORMAL HIGH (ref <5.7)
Mean Plasma Glucose: 131 mg/dL
eAG (mmol/L): 7.3 mmol/L

## 2021-03-20 DIAGNOSIS — F321 Major depressive disorder, single episode, moderate: Secondary | ICD-10-CM | POA: Diagnosis not present

## 2021-03-21 DIAGNOSIS — F321 Major depressive disorder, single episode, moderate: Secondary | ICD-10-CM | POA: Diagnosis not present

## 2021-03-22 DIAGNOSIS — F321 Major depressive disorder, single episode, moderate: Secondary | ICD-10-CM | POA: Diagnosis not present

## 2021-03-23 ENCOUNTER — Telehealth: Payer: Self-pay | Admitting: Internal Medicine

## 2021-03-23 NOTE — Telephone Encounter (Signed)
Pt following up on there results of her labs and starting a new cholesterol medication. Pt went to the pharmacy and it was not there.

## 2021-03-24 DIAGNOSIS — F321 Major depressive disorder, single episode, moderate: Secondary | ICD-10-CM | POA: Diagnosis not present

## 2021-03-24 MED ORDER — ATORVASTATIN CALCIUM 10 MG PO TABS
10.0000 mg | ORAL_TABLET | Freq: Every day | ORAL | 2 refills | Status: DC
Start: 2021-03-24 — End: 2021-07-29

## 2021-03-24 NOTE — Addendum Note (Signed)
Addended by: Lorre Munroe on: 03/24/2021 08:02 AM   Modules accepted: Orders

## 2021-03-24 NOTE — Telephone Encounter (Signed)
Atorvastatin sent to pharmacy.

## 2021-03-25 DIAGNOSIS — F321 Major depressive disorder, single episode, moderate: Secondary | ICD-10-CM | POA: Diagnosis not present

## 2021-03-27 DIAGNOSIS — F321 Major depressive disorder, single episode, moderate: Secondary | ICD-10-CM | POA: Diagnosis not present

## 2021-03-28 DIAGNOSIS — F321 Major depressive disorder, single episode, moderate: Secondary | ICD-10-CM | POA: Diagnosis not present

## 2021-03-29 DIAGNOSIS — F321 Major depressive disorder, single episode, moderate: Secondary | ICD-10-CM | POA: Diagnosis not present

## 2021-03-30 DIAGNOSIS — M25551 Pain in right hip: Secondary | ICD-10-CM | POA: Diagnosis not present

## 2021-03-30 DIAGNOSIS — F321 Major depressive disorder, single episode, moderate: Secondary | ICD-10-CM | POA: Diagnosis not present

## 2021-03-31 DIAGNOSIS — F321 Major depressive disorder, single episode, moderate: Secondary | ICD-10-CM | POA: Diagnosis not present

## 2021-04-03 DIAGNOSIS — F321 Major depressive disorder, single episode, moderate: Secondary | ICD-10-CM | POA: Diagnosis not present

## 2021-04-04 DIAGNOSIS — F321 Major depressive disorder, single episode, moderate: Secondary | ICD-10-CM | POA: Diagnosis not present

## 2021-04-05 DIAGNOSIS — F321 Major depressive disorder, single episode, moderate: Secondary | ICD-10-CM | POA: Diagnosis not present

## 2021-04-06 DIAGNOSIS — F321 Major depressive disorder, single episode, moderate: Secondary | ICD-10-CM | POA: Diagnosis not present

## 2021-04-07 DIAGNOSIS — F321 Major depressive disorder, single episode, moderate: Secondary | ICD-10-CM | POA: Diagnosis not present

## 2021-04-10 DIAGNOSIS — F321 Major depressive disorder, single episode, moderate: Secondary | ICD-10-CM | POA: Diagnosis not present

## 2021-04-11 DIAGNOSIS — F321 Major depressive disorder, single episode, moderate: Secondary | ICD-10-CM | POA: Diagnosis not present

## 2021-04-12 DIAGNOSIS — F321 Major depressive disorder, single episode, moderate: Secondary | ICD-10-CM | POA: Diagnosis not present

## 2021-04-13 DIAGNOSIS — F321 Major depressive disorder, single episode, moderate: Secondary | ICD-10-CM | POA: Diagnosis not present

## 2021-04-15 DIAGNOSIS — F321 Major depressive disorder, single episode, moderate: Secondary | ICD-10-CM | POA: Diagnosis not present

## 2021-04-18 DIAGNOSIS — F321 Major depressive disorder, single episode, moderate: Secondary | ICD-10-CM | POA: Diagnosis not present

## 2021-04-19 ENCOUNTER — Encounter: Payer: Self-pay | Admitting: Internal Medicine

## 2021-04-19 DIAGNOSIS — F321 Major depressive disorder, single episode, moderate: Secondary | ICD-10-CM | POA: Diagnosis not present

## 2021-04-21 DIAGNOSIS — F321 Major depressive disorder, single episode, moderate: Secondary | ICD-10-CM | POA: Diagnosis not present

## 2021-04-22 DIAGNOSIS — F321 Major depressive disorder, single episode, moderate: Secondary | ICD-10-CM | POA: Diagnosis not present

## 2021-04-23 DIAGNOSIS — F321 Major depressive disorder, single episode, moderate: Secondary | ICD-10-CM | POA: Diagnosis not present

## 2021-04-25 DIAGNOSIS — F321 Major depressive disorder, single episode, moderate: Secondary | ICD-10-CM | POA: Diagnosis not present

## 2021-04-26 ENCOUNTER — Ambulatory Visit: Payer: Medicaid Other | Admitting: Internal Medicine

## 2021-04-26 ENCOUNTER — Encounter: Payer: Self-pay | Admitting: Internal Medicine

## 2021-04-26 ENCOUNTER — Other Ambulatory Visit: Payer: Self-pay

## 2021-04-26 VITALS — BP 112/61 | HR 80 | Temp 98.0°F | Resp 17 | Ht 65.0 in | Wt 199.2 lb

## 2021-04-26 DIAGNOSIS — F39 Unspecified mood [affective] disorder: Secondary | ICD-10-CM | POA: Diagnosis not present

## 2021-04-26 DIAGNOSIS — E1169 Type 2 diabetes mellitus with other specified complication: Secondary | ICD-10-CM | POA: Insufficient documentation

## 2021-04-26 DIAGNOSIS — F321 Major depressive disorder, single episode, moderate: Secondary | ICD-10-CM | POA: Diagnosis not present

## 2021-04-26 DIAGNOSIS — R443 Hallucinations, unspecified: Secondary | ICD-10-CM

## 2021-04-26 DIAGNOSIS — E785 Hyperlipidemia, unspecified: Secondary | ICD-10-CM | POA: Insufficient documentation

## 2021-04-26 DIAGNOSIS — F333 Major depressive disorder, recurrent, severe with psychotic symptoms: Secondary | ICD-10-CM

## 2021-04-26 MED ORDER — ARIPIPRAZOLE 5 MG PO TABS
5.0000 mg | ORAL_TABLET | Freq: Every day | ORAL | 0 refills | Status: DC
Start: 2021-04-26 — End: 2021-05-27

## 2021-04-26 NOTE — Patient Instructions (Signed)
Psychosis Psychosis, also called thought disturbance, refers to a severe loss of contact with reality. People having a psychotic episode are not able to think clearly, and their emotions and responses do not match with what is actually happening. People having a psychotic episode may have false beliefs about what is happening or who they are (delusions). They may see, hear, taste, smell, or feel things that are not present (hallucinations). They may also be very upset (agitated), have chaotic behavior, or be very quiet and withdrawn. What are the causes? This condition may be caused by: Very serious mental health (psychiatric) conditions such as schizophrenia, bipolar disorder, or major depression. Use of drugs such as hallucinogens or alcohol. Medical conditions such as delirium or neurological disorders. What are the signs or symptoms? Symptoms of this condition include: Delusions, such as: Feeling a lot of fear or suspicion (paranoia). Believing something that is odd, unrealistic, or false, such as believing that you are someone else. Hallucinations, such as: Hearing or seeing things, smelling odors, experiencing tastes, or feeling bodily sensations. Command hallucinations that direct you to do something that could be dangerous. Disorganized thinking, such as thoughts that jump from one idea to another in a way that does not make sense. Disorganized speech, such as saying things that do not make sense, echoing others, or using words based on their sound rather than their meaning. Inappropriate behavior, such as talking to yourself, showing a clear increase or decrease in activity, or intruding on unfamiliar people. How is this diagnosed? This condition is diagnosed based on an assessment by a health care provider. The health care provider may ask questions about: Your thoughts, feelings, and behavior. Any medical conditions you have. Any use of alcohol or drugs. One or more of the  following may also be done: A physical exam. Blood tests. Brain imaging, such as a CT scan or MRI. A brain wave study (electroencephalogram, or EEG). The health care provider may refer you to a mental health professional for further tests. How is this treated? Treatment for this condition may depend on the cause of the psychosis. Treatment may include one or more of the following: Supportive care and monitoring in the emergency room or hospital. You may need to stay in the hospital if you are a danger to yourself or others. Taking antipsychotic medicines to reduce symptoms and to balance chemicals in the brain. Treating an underlying medical condition. Stopping or reducing drugs that are causing psychosis. Therapy and other supportive programs, such as: Ongoing treatment and care from a mental health professional. Individual or family therapy. Training to learn new skills to cope with the psychosis and prevent further episodes. Follow these instructions at home: Take over-the-counter and prescription medicines only as told by your health care provider. Consult a health care provider before taking over-the-counter medicines, herbs, or supplements. Surround yourself with people who care about you and can help manage your condition. Keep stress under control. Stress may trigger psychosis and make symptoms worse. Maintain a healthy lifestyle. This includes: Eating a healthy diet. Getting enough sleep. Exercising regularly. Avoiding alcohol, nicotine, and recreational drugs. Keep all follow-up visits as told by your health care provider. This is important. Contact a health care provider if: Medicines do not seem to be helping. You or others notice that you: Continue to see, smell, or feel things that are not there. Hear voices telling you to do things. Feel extremely fearful and suspicious that someone or something will harm you. Feel unable to leave  your house. Have trouble taking care  of yourself. You have side effects of medicines, such as: Changes in sleep patterns. Dizziness. Weight gain. Restlessness. Movement changes. Shaking that you cannot control (tremors). Get help right away if: You have serious side effects of medicine, such as: Swelling of the face, lips, tongue, or throat. Fever, confusion, muscle spasms, or seizures. You have serious thoughts about harming yourself or hurting others. If you ever feel like you may hurt yourself or others, or have thoughts about taking your own life, get help right away. You can go to your nearest emergency department or call: Your local emergency services (911 in the [place]  A suicide crisis helpline, such as the National Suicide Prevention Lifeline at 848-776-1903 or 988 in the [place] This is open 24 hours a day. Summary Psychosis refers to a severe loss of contact with reality. People having a psychotic episode are not able to think clearly, and they may have delusions or hallucinations. Psychosis is a serious medical condition that should be treated by a medical professional as soon as possible. Being checked and treated right away can stop or reduce symptoms. This prevents more serious problems from developing. In some cases, treatment may include taking antipsychotic medicines to reduce symptoms and to balance chemicals in the brain. Support programs may help you learn new skills to cope with the psychosis and prevent further episodes. This information is not intended to replace advice given to you by your health care provider. Make sure you discuss any questions you have with your health care provider. Document Revised: 01/13/2021 Document Reviewed: 06/19/2017 Elsevier Patient Education  2022 [employer].

## 2021-04-26 NOTE — Progress Notes (Signed)
Subjective:    Patient ID: Meagan Medina, female    DOB: 04-10-1981, 40 y.o.   MRN: [id number]  HPI  Patient presents the clinic today with complaint of anxiety and depression.  She feels like this is triggered by general life stress.  She is currently living in a hotel since 06/2020 and having a hard time finding a place to live.  She has started having audio and visual hallucinations.  She is in chronic pain, due to have a hip surgery in March 2023.  She is not currently seeing a therapist. She has been treated with Paroxetine in the past. She denies anxiety, SI/HI.  Review of Systems     Past Medical History:  Diagnosis Date   Abnormal Pap smear of cervix    Depression    GERD (gastroesophageal reflux disease)     Current Outpatient Medications  Medication Sig Dispense Refill   atorvastatin (LIPITOR) 10 MG tablet Take 1 tablet (10 mg total) by mouth daily. 30 tablet 2   famotidine (PEPCID) 10 MG tablet Take 1 tablet (10 mg total) by mouth 2 (two) times daily. 180 tablet 1   HYDROcodone-acetaminophen (NORCO/VICODIN) 5-325 MG tablet Take 1 tablet by mouth every 6 (six) hours as needed for moderate pain. 15 tablet 0   naproxen (EC NAPROSYN) 500 MG EC tablet Take 1 tablet (500 mg total) by mouth 2 (two) times daily with a meal. 60 tablet 2   No current facility-administered medications for this visit.    Allergies  Allergen Reactions   Ibuprofen Nausea And Vomiting and Other (See Comments)    Family History  Problem Relation Age of Onset   Breast cancer Neg Hx     Social History   Socioeconomic History   Marital status: Single    Spouse name: Not on file   Number of children: Not on file   Years of education: Not on file   Highest education level: Not on file  Occupational History   Not on file  Tobacco Use   Smoking status: Former    Years: 1.00    Types: Cigarettes    Quit date: 2003    Years since quitting: 19.9   Smokeless tobacco: Never  Vaping Use    Vaping Use: Never used  Substance and Sexual Activity   Alcohol use: Not Currently   Drug use: Never   Sexual activity: Yes    Partners: Male    Birth control/protection: Condom  Other Topics Concern   Not on file  Social History Narrative   Not on file   Social Determinants of Health   Financial Resource Strain: Not on file  Food Insecurity: Not on file  Transportation Needs: Not on file  Physical Activity: Not on file  Stress: Not on file  Social Connections: Not on file  Intimate Partner Violence: Not on file     Constitutional: Denies fever, malaise, fatigue, headache or abrupt weight changes.  Respiratory: Denies difficulty breathing, shortness of breath, cough or sputum production.   Cardiovascular: Denies chest pain, chest tightness, palpitations or swelling in the hands or feet.  Musculoskeletal: Patient reports chronic hip pain, difficulty with gait.  Denies decrease in range of motion, muscle pain or joint swelling.  Skin: Denies redness, rashes, lesions or ulcercations.  Neurological: Denies dizziness, difficulty with memory, difficulty with speech or problems with balance and coordination.  Psych: Patient reports anxiety, depression and hallucinations.  Denies SI/HI.  No other specific complaints in a complete review  of systems (except as listed in HPI above).  Objective:   Physical Exam BP 112/61 (BP Location: Right Arm, Patient Position: Sitting, Cuff Size: Large)   Pulse 80   Temp 98 F (36.7 C) (Temporal)   Resp 17   Ht 5\' 5"  (1.651 m)   Wt 199 lb 3.2 oz (90.4 kg)   SpO2 100%   BMI 33.15 kg/m   Wt Readings from Last 3 Encounters:  03/16/21 196 lb (88.9 kg)  01/25/21 160 lb (72.6 kg)  05/27/20 203 lb (92.1 kg)    General: Appears her stated age, obese, in NAD. Skin: Warm, dry and intact.  HEENT: Head: normal shape and size; Eyes: sclera white, no icterus, conjunctiva pink, PERRLA and EOMs intact;  Cardiovascular: Normal rate and rhythm.   Pulmonary/Chest: Normal effort and positive vesicular breath sounds.  Musculoskeletal: Gait slow and steady with use of rolling walker. Neurological: Alert and oriented.  Psychiatric: Mood and affect flat.  Behavior is normal. Judgment and thought content normal.    BMET    Component Value Date/Time   NA 139 03/16/2021 0958   NA 136 12/22/2012 1242   K 4.3 03/16/2021 0958   K 3.7 12/22/2012 1242   CL 105 03/16/2021 0958   CL 107 12/22/2012 1242   CO2 24 03/16/2021 0958   CO2 23 12/22/2012 1242   GLUCOSE 91 03/16/2021 0958   GLUCOSE 89 12/22/2012 1242   BUN 17 03/16/2021 0958   BUN 10 12/22/2012 1242   CREATININE 0.88 03/16/2021 0958   CALCIUM 9.5 03/16/2021 0958   CALCIUM 9.7 12/22/2012 1242   GFRNONAA >60 01/25/2021 0809   GFRNONAA >60 12/22/2012 1242   GFRAA >60 12/22/2012 1242    Lipid Panel     Component Value Date/Time   CHOL 262 (H) 03/16/2021 0958   TRIG 157 (H) 03/16/2021 0958   HDL 51 03/16/2021 0958   CHOLHDL 5.1 (H) 03/16/2021 0958   LDLCALC 181 (H) 03/16/2021 0958    CBC    Component Value Date/Time   WBC 5.3 03/16/2021 0958   RBC 5.04 03/16/2021 0958   HGB 10.9 (L) 03/16/2021 0958   HGB 11.4 (L) 07/04/2013 1120   HCT 36.3 03/16/2021 0958   HCT 32.1 (L) 07/08/2013 0616   PLT 329 03/16/2021 0958   PLT 264 07/04/2013 1120   MCV 72.0 (L) 03/16/2021 0958   MCV 74 (L) 07/04/2013 1120   MCH 21.6 (L) 03/16/2021 0958   MCHC 30.0 (L) 03/16/2021 0958   RDW 16.7 (H) 03/16/2021 0958   RDW 15.5 (H) 07/04/2013 1120   LYMPHSABS 1.0 07/04/2013 1120   MONOABS 0.6 07/04/2013 1120   EOSABS 0.0 07/04/2013 1120   BASOSABS 0.0 07/04/2013 1120    Hgb A1C Lab Results  Component Value Date   HGBA1C 6.2 (H) 03/16/2021            Assessment & Plan:     03/18/2021, NP This visit occurred during the SARS-CoV-2 public health emergency.  Safety protocols were in place, including screening questions prior to the visit, additional usage of staff PPE, and  extensive cleaning of exam room while observing appropriate contact time as indicated for disinfecting solutions.

## 2021-04-26 NOTE — Assessment & Plan Note (Signed)
Referral to psychiatry for formal evaluation and diagnosis Rx for Abilify 5 mg daily Support offered  Update me in 1 month and let me know how you are doing

## 2021-04-27 DIAGNOSIS — F321 Major depressive disorder, single episode, moderate: Secondary | ICD-10-CM | POA: Diagnosis not present

## 2021-04-28 DIAGNOSIS — F321 Major depressive disorder, single episode, moderate: Secondary | ICD-10-CM | POA: Diagnosis not present

## 2021-04-29 DIAGNOSIS — F321 Major depressive disorder, single episode, moderate: Secondary | ICD-10-CM | POA: Diagnosis not present

## 2021-05-01 DIAGNOSIS — F321 Major depressive disorder, single episode, moderate: Secondary | ICD-10-CM | POA: Diagnosis not present

## 2021-05-02 DIAGNOSIS — F321 Major depressive disorder, single episode, moderate: Secondary | ICD-10-CM | POA: Diagnosis not present

## 2021-05-03 DIAGNOSIS — F321 Major depressive disorder, single episode, moderate: Secondary | ICD-10-CM | POA: Diagnosis not present

## 2021-05-04 DIAGNOSIS — F321 Major depressive disorder, single episode, moderate: Secondary | ICD-10-CM | POA: Diagnosis not present

## 2021-05-07 DIAGNOSIS — F321 Major depressive disorder, single episode, moderate: Secondary | ICD-10-CM | POA: Diagnosis not present

## 2021-05-08 DIAGNOSIS — F321 Major depressive disorder, single episode, moderate: Secondary | ICD-10-CM | POA: Diagnosis not present

## 2021-05-09 DIAGNOSIS — F321 Major depressive disorder, single episode, moderate: Secondary | ICD-10-CM | POA: Diagnosis not present

## 2021-05-10 DIAGNOSIS — F321 Major depressive disorder, single episode, moderate: Secondary | ICD-10-CM | POA: Diagnosis not present

## 2021-05-11 DIAGNOSIS — F321 Major depressive disorder, single episode, moderate: Secondary | ICD-10-CM | POA: Diagnosis not present

## 2021-05-12 DIAGNOSIS — F321 Major depressive disorder, single episode, moderate: Secondary | ICD-10-CM | POA: Diagnosis not present

## 2021-05-15 DIAGNOSIS — F321 Major depressive disorder, single episode, moderate: Secondary | ICD-10-CM | POA: Diagnosis not present

## 2021-05-16 DIAGNOSIS — F321 Major depressive disorder, single episode, moderate: Secondary | ICD-10-CM | POA: Diagnosis not present

## 2021-05-18 DIAGNOSIS — F321 Major depressive disorder, single episode, moderate: Secondary | ICD-10-CM | POA: Diagnosis not present

## 2021-05-21 DIAGNOSIS — F321 Major depressive disorder, single episode, moderate: Secondary | ICD-10-CM | POA: Diagnosis not present

## 2021-05-22 DIAGNOSIS — F321 Major depressive disorder, single episode, moderate: Secondary | ICD-10-CM | POA: Diagnosis not present

## 2021-05-24 DIAGNOSIS — F321 Major depressive disorder, single episode, moderate: Secondary | ICD-10-CM | POA: Diagnosis not present

## 2021-05-25 DIAGNOSIS — F321 Major depressive disorder, single episode, moderate: Secondary | ICD-10-CM | POA: Diagnosis not present

## 2021-05-26 DIAGNOSIS — F321 Major depressive disorder, single episode, moderate: Secondary | ICD-10-CM | POA: Diagnosis not present

## 2021-05-27 ENCOUNTER — Other Ambulatory Visit (HOSPITAL_COMMUNITY): Payer: Self-pay

## 2021-05-27 ENCOUNTER — Other Ambulatory Visit: Payer: Self-pay | Admitting: Internal Medicine

## 2021-05-27 DIAGNOSIS — F321 Major depressive disorder, single episode, moderate: Secondary | ICD-10-CM | POA: Diagnosis not present

## 2021-05-27 MED ORDER — ARIPIPRAZOLE 5 MG PO TABS
5.0000 mg | ORAL_TABLET | Freq: Every day | ORAL | 0 refills | Status: DC
  Filled 2021-05-27: qty 30, 30d supply, fill #0

## 2021-05-27 NOTE — Telephone Encounter (Signed)
I need an update on how this medication is working before it can be refilled

## 2021-05-28 ENCOUNTER — Other Ambulatory Visit (HOSPITAL_COMMUNITY): Payer: Self-pay

## 2021-05-29 DIAGNOSIS — F321 Major depressive disorder, single episode, moderate: Secondary | ICD-10-CM | POA: Diagnosis not present

## 2021-05-30 DIAGNOSIS — F321 Major depressive disorder, single episode, moderate: Secondary | ICD-10-CM | POA: Diagnosis not present

## 2021-05-31 DIAGNOSIS — F321 Major depressive disorder, single episode, moderate: Secondary | ICD-10-CM | POA: Diagnosis not present

## 2021-06-02 DIAGNOSIS — F321 Major depressive disorder, single episode, moderate: Secondary | ICD-10-CM | POA: Diagnosis not present

## 2021-06-04 DIAGNOSIS — F321 Major depressive disorder, single episode, moderate: Secondary | ICD-10-CM | POA: Diagnosis not present

## 2021-06-05 DIAGNOSIS — F321 Major depressive disorder, single episode, moderate: Secondary | ICD-10-CM | POA: Diagnosis not present

## 2021-06-06 DIAGNOSIS — F321 Major depressive disorder, single episode, moderate: Secondary | ICD-10-CM | POA: Diagnosis not present

## 2021-06-07 DIAGNOSIS — F321 Major depressive disorder, single episode, moderate: Secondary | ICD-10-CM | POA: Diagnosis not present

## 2021-06-08 DIAGNOSIS — F321 Major depressive disorder, single episode, moderate: Secondary | ICD-10-CM | POA: Diagnosis not present

## 2021-06-09 DIAGNOSIS — F321 Major depressive disorder, single episode, moderate: Secondary | ICD-10-CM | POA: Diagnosis not present

## 2021-06-13 DIAGNOSIS — F321 Major depressive disorder, single episode, moderate: Secondary | ICD-10-CM | POA: Diagnosis not present

## 2021-06-14 DIAGNOSIS — F321 Major depressive disorder, single episode, moderate: Secondary | ICD-10-CM | POA: Diagnosis not present

## 2021-06-15 DIAGNOSIS — F321 Major depressive disorder, single episode, moderate: Secondary | ICD-10-CM | POA: Diagnosis not present

## 2021-06-16 DIAGNOSIS — F321 Major depressive disorder, single episode, moderate: Secondary | ICD-10-CM | POA: Diagnosis not present

## 2021-06-17 DIAGNOSIS — F321 Major depressive disorder, single episode, moderate: Secondary | ICD-10-CM | POA: Diagnosis not present

## 2021-06-19 DIAGNOSIS — F321 Major depressive disorder, single episode, moderate: Secondary | ICD-10-CM | POA: Diagnosis not present

## 2021-06-20 DIAGNOSIS — F321 Major depressive disorder, single episode, moderate: Secondary | ICD-10-CM | POA: Diagnosis not present

## 2021-06-21 DIAGNOSIS — F321 Major depressive disorder, single episode, moderate: Secondary | ICD-10-CM | POA: Diagnosis not present

## 2021-06-22 DIAGNOSIS — F321 Major depressive disorder, single episode, moderate: Secondary | ICD-10-CM | POA: Diagnosis not present

## 2021-06-23 ENCOUNTER — Ambulatory Visit: Payer: Medicaid Other | Admitting: Internal Medicine

## 2021-06-23 DIAGNOSIS — F321 Major depressive disorder, single episode, moderate: Secondary | ICD-10-CM | POA: Diagnosis not present

## 2021-06-24 ENCOUNTER — Ambulatory Visit: Payer: Self-pay | Admitting: Psychiatry

## 2021-06-26 DIAGNOSIS — F321 Major depressive disorder, single episode, moderate: Secondary | ICD-10-CM | POA: Diagnosis not present

## 2021-06-27 ENCOUNTER — Ambulatory Visit: Payer: Medicaid Other | Admitting: Internal Medicine

## 2021-06-28 DIAGNOSIS — F321 Major depressive disorder, single episode, moderate: Secondary | ICD-10-CM | POA: Diagnosis not present

## 2021-06-29 DIAGNOSIS — F321 Major depressive disorder, single episode, moderate: Secondary | ICD-10-CM | POA: Diagnosis not present

## 2021-06-30 DIAGNOSIS — F321 Major depressive disorder, single episode, moderate: Secondary | ICD-10-CM | POA: Diagnosis not present

## 2021-07-01 DIAGNOSIS — F321 Major depressive disorder, single episode, moderate: Secondary | ICD-10-CM | POA: Diagnosis not present

## 2021-07-03 DIAGNOSIS — F321 Major depressive disorder, single episode, moderate: Secondary | ICD-10-CM | POA: Diagnosis not present

## 2021-07-05 DIAGNOSIS — F321 Major depressive disorder, single episode, moderate: Secondary | ICD-10-CM | POA: Diagnosis not present

## 2021-07-06 DIAGNOSIS — F321 Major depressive disorder, single episode, moderate: Secondary | ICD-10-CM | POA: Diagnosis not present

## 2021-07-07 DIAGNOSIS — F321 Major depressive disorder, single episode, moderate: Secondary | ICD-10-CM | POA: Diagnosis not present

## 2021-07-08 DIAGNOSIS — F321 Major depressive disorder, single episode, moderate: Secondary | ICD-10-CM | POA: Diagnosis not present

## 2021-07-11 DIAGNOSIS — F321 Major depressive disorder, single episode, moderate: Secondary | ICD-10-CM | POA: Diagnosis not present

## 2021-07-12 DIAGNOSIS — F321 Major depressive disorder, single episode, moderate: Secondary | ICD-10-CM | POA: Diagnosis not present

## 2021-07-13 DIAGNOSIS — F321 Major depressive disorder, single episode, moderate: Secondary | ICD-10-CM | POA: Diagnosis not present

## 2021-07-14 ENCOUNTER — Ambulatory Visit: Payer: Medicaid Other | Admitting: Internal Medicine

## 2021-07-15 DIAGNOSIS — F321 Major depressive disorder, single episode, moderate: Secondary | ICD-10-CM | POA: Diagnosis not present

## 2021-07-16 DIAGNOSIS — F321 Major depressive disorder, single episode, moderate: Secondary | ICD-10-CM | POA: Diagnosis not present

## 2021-07-19 ENCOUNTER — Ambulatory Visit: Payer: Self-pay | Admitting: Psychiatry

## 2021-07-28 ENCOUNTER — Other Ambulatory Visit: Payer: Self-pay

## 2021-07-28 ENCOUNTER — Encounter: Payer: Self-pay | Admitting: Internal Medicine

## 2021-07-28 ENCOUNTER — Ambulatory Visit: Payer: Medicaid Other | Admitting: Internal Medicine

## 2021-07-28 VITALS — BP 106/62 | HR 85 | Temp 97.5°F | Wt 197.0 lb

## 2021-07-28 DIAGNOSIS — D500 Iron deficiency anemia secondary to blood loss (chronic): Secondary | ICD-10-CM

## 2021-07-28 DIAGNOSIS — R7303 Prediabetes: Secondary | ICD-10-CM | POA: Diagnosis not present

## 2021-07-28 DIAGNOSIS — K219 Gastro-esophageal reflux disease without esophagitis: Secondary | ICD-10-CM | POA: Diagnosis not present

## 2021-07-28 DIAGNOSIS — M1651 Unilateral post-traumatic osteoarthritis, right hip: Secondary | ICD-10-CM | POA: Diagnosis not present

## 2021-07-28 DIAGNOSIS — E6609 Other obesity due to excess calories: Secondary | ICD-10-CM

## 2021-07-28 DIAGNOSIS — E782 Mixed hyperlipidemia: Secondary | ICD-10-CM | POA: Diagnosis not present

## 2021-07-28 DIAGNOSIS — Z6832 Body mass index (BMI) 32.0-32.9, adult: Secondary | ICD-10-CM

## 2021-07-28 DIAGNOSIS — F39 Unspecified mood [affective] disorder: Secondary | ICD-10-CM

## 2021-07-28 MED ORDER — FAMOTIDINE 10 MG PO TABS: 10.0000 mg | ORAL_TABLET | Freq: Two times a day (BID) | ORAL | 1 refills | Status: AC

## 2021-07-28 NOTE — Patient Instructions (Signed)
Heart-Healthy Eating Plan Heart-healthy meal planning includes: Eating less unhealthy fats. Eating more healthy fats. Making other changes in your diet. Talk with your doctor or a diet specialist (dietitian) to create an eating plan that is right for you. What is my plan? Your doctor may recommend an eating plan that includes: Total fat: ______% or less of total calories a day. Saturated fat: ______% or less of total calories a day. Cholesterol: less than _________mg a day. What are tips for following this plan? Cooking Avoid frying your food. Try to bake, boil, grill, or broil it instead. You can also reduce fat by: Removing the skin from poultry. Removing all visible fats from meats. Steaming vegetables in water or broth. Meal planning  At meals, divide your plate into four equal parts: Fill one-half of your plate with vegetables and green salads. Fill one-fourth of your plate with whole grains. Fill one-fourth of your plate with lean protein foods. Eat 4-5 servings of vegetables per day. A serving of vegetables is: 1 cup of raw or cooked vegetables. 2 cups of raw leafy greens. Eat 4-5 servings of fruit per day. A serving of fruit is: 1 medium whole fruit.  cup of dried fruit.  cup of fresh, frozen, or canned fruit.  cup of 100% fruit juice. Eat more foods that have soluble fiber. These are apples, broccoli, carrots, beans, peas, and barley. Try to get 20-30 g of fiber per day. Eat 4-5 servings of nuts, legumes, and seeds per week: 1 serving of dried beans or legumes equals  cup after being cooked. 1 serving of nuts is  cup. 1 serving of seeds equals 1 tablespoon. General information Eat more home-cooked food. Eat less restaurant, buffet, and fast food. Limit or avoid alcohol. Limit foods that are high in starch and sugar. Avoid fried foods. Lose weight if you are overweight. Keep track of how much salt (sodium) you eat. This is important if you have high blood  pressure. Ask your doctor to tell you more about this. Try to add vegetarian meals each week. Fats Choose healthy fats. These include olive oil and canola oil, flaxseeds, walnuts, almonds, and seeds. Eat more omega-3 fats. These include salmon, mackerel, sardines, tuna, flaxseed oil, and ground flaxseeds. Try to eat fish at least 2 times each week. Check food labels. Avoid foods with trans fats or high amounts of saturated fat. Limit saturated fats. These are often found in animal products, such as meats, butter, and cream. These are also found in plant foods, such as palm oil, palm kernel oil, and coconut oil. Avoid foods with partially hydrogenated oils in them. These have trans fats. Examples are stick margarine, some tub margarines, cookies, crackers, and other baked goods. What foods can I eat? Fruits All fresh, canned (in natural juice), or frozen fruits. Vegetables Fresh or frozen vegetables (raw, steamed, roasted, or grilled). Green salads. Grains Most grains. Choose whole wheat and whole grains most of the time. Rice and pasta, including brown rice and pastas made with whole wheat. Meats and other proteins Lean, well-trimmed beef, veal, pork, and lamb. Chicken and turkey without skin. All fish and shellfish. Wild duck, rabbit, pheasant, and venison. Egg whites or low-cholesterol egg substitutes. Dried beans, peas, lentils, and tofu. Seeds and most nuts. Dairy Low-fat or nonfat cheeses, including ricotta and mozzarella. Skim or 1% milk that is liquid, powdered, or evaporated. Buttermilk that is made with low-fat milk. Nonfat or low-fat yogurt. Fats and oils Non-hydrogenated (trans-free) margarines. Vegetable oils, including   soybean, sesame, sunflower, olive, peanut, safflower, corn, canola, and cottonseed. Salad dressings or mayonnaise made with a vegetable oil. Beverages Mineral water. Coffee and tea. Diet carbonated beverages. Sweets and desserts Sherbet, gelatin, and fruit ice.  Small amounts of dark chocolate. Limit all sweets and desserts. Seasonings and condiments All seasonings and condiments. The items listed above may not be a complete list of foods and drinks you can eat. Contact a dietitian for more options. What foods should I avoid? Fruits Canned fruit in heavy syrup. Fruit in cream or butter sauce. Fried fruit. Limit coconut. Vegetables Vegetables cooked in cheese, cream, or butter sauce. Fried vegetables. Grains Breads that are made with saturated or trans fats, oils, or whole milk. Croissants. Sweet rolls. Donuts. High-fat crackers, such as cheese crackers. Meats and other proteins Fatty meats, such as hot dogs, ribs, sausage, bacon, rib-eye roast or steak. High-fat deli meats, such as salami and bologna. Caviar. Domestic duck and goose. Organ meats, such as liver. Dairy Cream, sour cream, cream cheese, and creamed cottage cheese. Whole-milk cheeses. Whole or 2% milk that is liquid, evaporated, or condensed. Whole buttermilk. Cream sauce or high-fat cheese sauce. Yogurt that is made from whole milk. Fats and oils Meat fat, or shortening. Cocoa butter, hydrogenated oils, palm oil, coconut oil, palm kernel oil. Solid fats and shortenings, including bacon fat, salt pork, lard, and butter. Nondairy cream substitutes. Salad dressings with cheese or sour cream. Beverages Regular sodas and juice drinks with added sugar. Sweets and desserts Frosting. Pudding. Cookies. Cakes. Pies. Milk chocolate or white chocolate. Buttered syrups. Full-fat ice cream or ice cream drinks. The items listed above may not be a complete list of foods and drinks to avoid. Contact a dietitian for more information. Summary Heart-healthy meal planning includes eating less unhealthy fats, eating more healthy fats, and making other changes in your diet. Eat a balanced diet. This includes fruits and vegetables, low-fat or nonfat dairy, lean protein, nuts and legumes, whole grains, and  heart-healthy oils and fats. This information is not intended to replace advice given to you by your health care provider. Make sure you discuss any questions you have with your health care provider. Document Revised: 09/16/2020 Document Reviewed: 09/16/2020 Elsevier Patient Education  2022 Elsevier Inc.  

## 2021-07-28 NOTE — Assessment & Plan Note (Signed)
Continue Naproxen and Hydrocodone for pain ?Encourage weight loss as this can help reduce joint pain ?See has surgery pending for later this month ?

## 2021-07-28 NOTE — Assessment & Plan Note (Signed)
Encourage weight loss as this can help reduce reflux symptoms ?Famotidine refilled today ?

## 2021-07-28 NOTE — Assessment & Plan Note (Signed)
A1c today Encourage low-carb diet and exercise for weight loss 

## 2021-07-28 NOTE — Progress Notes (Signed)
? ?Subjective:  ? ? Patient ID: Meagan Medina, female    DOB: March 13, 1981, 41 y.o.   MRN: [id number] ? ?HPI ? ?Patient presents to clinic today for follow-up of chronic conditions. ? 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  She has been taking the medication as prescribed.  She has an appointment with psychiatry and therapy later in the month.  She denies anxiety, SI/HI. ? ?HLD: Her last LDL was 181, triglycerides 157, 04/2019.  She denies myalgias on Atorvastatin.  She does not consume a low-fat diet. ? ?Prediabetes: Her last A1c was 6.2%, 04/2021.  She is not taking any oral diabetic medication at this time.  She does not check her sugars. ? ?GERD: She denies breakthrough on Famotidine.  She would like a refill of this today.  There is no upper GI on file. ? 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  She reports she is scheduled for surgery 08/15/2021.  She is taking Naproxen and Hydrocodone as prescribed by orthopedics. ? ?Anemia: Her last H/H was 10.9/36.3, 04/2021.  She is not taking any oral iron at this time. She does not follow with hematology.  ? ?Review of Systems ? ?Past Medical History:  ?Diagnosis Date  ? Abnormal Pap smear of cervix   ? Depression   ? GERD (gastroesophageal reflux disease)   ? ? ?Current Outpatient Medications  ?Medication Sig Dispense Refill  ? ARIPiprazole (ABILIFY) 5 MG tablet Take 1 tablet  by mouth daily. 30 tablet 0  ? atorvastatin (LIPITOR) 10 MG tablet Take 1 tablet (10 mg total) by mouth daily. 30 tablet 2  ? famotidine (PEPCID) 10 MG tablet Take 1 tablet (10 mg total) by mouth 2 (two) times daily. 180 tablet 1  ? HYDROcodone-acetaminophen (NORCO/VICODIN) 5-325 MG tablet Take 1 tablet by mouth every 6 (six) hours as needed for moderate pain. 15 tablet 0  ? naproxen (EC NAPROSYN) 500 MG EC tablet Take 1 tablet (500 mg total) by mouth 2 (two) times daily with a meal. 60 tablet 2  ? ?No current facility-administered medications for this visit.  ? ? ?Allergies   ?Allergen Reactions  ? Ibuprofen Nausea And Vomiting and Other (See Comments)  ? ? ?Family History  ?Problem Relation Age of Onset  ? Breast cancer Neg Hx   ? ? ?Social History  ? ?Socioeconomic History  ? Marital status: Single  ?  Spouse name: Not on file  ? Number of children: Not on file  ? Years of education: Not on file  ? Highest education level: Not on file  ?Occupational History  ? Not on file  ?Tobacco Use  ? Smoking status: Former  ?  Years: 1.00  ?  Types: Cigarettes  ?  Quit date: 2003  ?  Years since quitting: 20.1  ? Smokeless tobacco: Never  ?Vaping Use  ? Vaping Use: Never used  ?Substance and Sexual Activity  ? Alcohol use: Not Currently  ? Drug use: Never  ? Sexual activity: Yes  ?  Partners: Male  ?  Birth control/protection: Condom  ?Other Topics Concern  ? Not on file  ?Social History Narrative  ? Not on file  ? ?Social Determinants of Health  ? ?Financial Resource Strain: Not on file  ?Food Insecurity: Not on file  ?Transportation Needs: Not on file  ?Physical Activity: Not on file  ?Stress: Not on file  ?Social Connections: Not on file  ?Intimate Partner Violence: Not on file  ? ? ? ?  Constitutional: Denies fever, malaise, fatigue, headache or abrupt weight changes.  ?HEENT: Denies eye pain, eye redness, ear pain, ringing in the ears, wax buildup, runny nose, nasal congestion, bloody nose, or sore throat. ?Respiratory: Denies difficulty breathing, shortness of breath, cough or sputum production.   ?Cardiovascular: Denies chest pain, chest tightness, palpitations or swelling in the hands or feet.  ?Gastrointestinal: Denies abdominal pain, bloating, constipation, diarrhea or blood in the stool.  ?GU: Denies urgency, frequency, pain with urination, burning sensation, blood in urine, odor or discharge. ?Musculoskeletal: Patient reports right hip pain.  Denies decrease in range of motion, difficulty with gait, muscle pain or joint swelling.  ?Skin: Denies redness, rashes, lesions or ulcercations.   ?Neurological: Denies dizziness, difficulty with memory, difficulty with speech or problems with balance and coordination.  ?Psych: Patient has history of depression.  Denies anxiety, SI/HI. ? ?No other specific complaints in a complete review of systems (except as listed in HPI above). ? ?   ?Objective:  ? Physical Exam ? ? ?BP 106/62 (BP Location: Right Arm, Patient Position: Sitting, Cuff Size: Large)   Pulse 85   Temp (!) 97.5 ?F (36.4 ?C) (Temporal)   Wt 197 lb (89.4 kg)   SpO2 100%   BMI 32.78 kg/m?  ? ?Wt Readings from Last 3 Encounters:  ?04/26/21 199 lb 3.2 oz (90.4 kg)  ?03/16/21 196 lb (88.9 kg)  ?01/25/21 160 lb (72.6 kg)  ? ? ?General: Appears her stated age, overweight, in NAD. ?Skin: Warm, dry and intact. ?Cardiovascular: Normal rate and rhythm. S1,S2 noted.  No murmur, rubs or gallops noted. No JVD or BLE edema.  ?Pulmonary/Chest: Normal effort and positive vesicular breath sounds. No respiratory distress. No wheezes, rales or ronchi noted.  ?Abdomen: Soft and nontender. Normal bowel sounds.  ?Musculoskeletal: Gait slow and steady with use of rolling walker. ?Neurological: Alert and oriented.  ?Psychiatric: Mood and affect flat. Behavior is normal. Judgment and thought content normal.  ? ? ?BMET ?   ?Component Value Date/Time  ? NA 139 03/16/2021 0958  ? NA 136 12/22/2012 1242  ? K 4.3 03/16/2021 0958  ? K 3.7 12/22/2012 1242  ? CL 105 03/16/2021 0958  ? CL 107 12/22/2012 1242  ? CO2 24 03/16/2021 0958  ? CO2 23 12/22/2012 1242  ? GLUCOSE 91 03/16/2021 0958  ? GLUCOSE 89 12/22/2012 1242  ? BUN 17 03/16/2021 0958  ? BUN 10 12/22/2012 1242  ? CREATININE 0.88 03/16/2021 0958  ? CALCIUM 9.5 03/16/2021 0958  ? CALCIUM 9.7 12/22/2012 1242  ? GFRNONAA >60 01/25/2021 0809  ? GFRNONAA >60 12/22/2012 1242  ? GFRAA >60 12/22/2012 1242  ? ? ?Lipid Panel  ?   ?Component Value Date/Time  ? CHOL 262 (H) 03/16/2021 1660  ? TRIG 157 (H) 03/16/2021 0958  ? HDL 51 03/16/2021 0958  ? CHOLHDL 5.1 (H) 03/16/2021  6301  ? LDLCALC 181 (H) 03/16/2021 0958  ? ? ?CBC ?   ?Component Value Date/Time  ? WBC 5.3 03/16/2021 0958  ? RBC 5.04 03/16/2021 0958  ? HGB 10.9 (L) 03/16/2021 0958  ? HGB 11.4 (L) 07/04/2013 1120  ? HCT 36.3 03/16/2021 0958  ? HCT 32.1 (L) 07/08/2013 6010  ? PLT 329 03/16/2021 0958  ? PLT 264 07/04/2013 1120  ? MCV 72.0 (L) 03/16/2021 0958  ? MCV 74 (L) 07/04/2013 1120  ? MCH 21.6 (L) 03/16/2021 9323  ? MCHC 30.0 (L) 03/16/2021 0958  ? RDW 16.7 (H) 03/16/2021 5573  ? RDW 15.5 (H)  07/04/2013 1120  ? LYMPHSABS 1.0 07/04/2013 1120  ? MONOABS 0.6 07/04/2013 1120  ? EOSABS 0.0 07/04/2013 1120  ? BASOSABS 0.0 07/04/2013 1120  ? ? ?Hgb A1C ?Lab Results  ?Component Value Date  ? HGBA1C 6.2 (H) 03/16/2021  ? ? ? ? ? ? ? ? ? ?   ?Assessment & Plan:  ? ? ? ?Nicki Reaper, NP ?This visit occurred during the SARS-CoV-2 public health emergency.  Safety protocols were in place, including screening questions prior to the visit, additional usage of staff PPE, and extensive cleaning of exam room while observing appropriate contact time as indicated for disinfecting solutions.  ? ?

## 2021-07-28 NOTE — Assessment & Plan Note (Signed)
C-Met and lipid profile today ?Encouraged her to consume low-fat diet ?Continue Atorvastatin ?

## 2021-07-28 NOTE — Assessment & Plan Note (Signed)
Not on iron ?Will monitor ?

## 2021-07-28 NOTE — Assessment & Plan Note (Signed)
Stable on Abilify ?She has appointment with psychiatry and therapy later this month ?We will monitor ?

## 2021-07-28 NOTE — Assessment & Plan Note (Signed)
Encourage weight loss as this can help reduce joint pain and reflux ?

## 2021-07-29 ENCOUNTER — Telehealth: Payer: Self-pay

## 2021-07-29 LAB — COMPLETE METABOLIC PANEL WITH GFR
AG Ratio: 1.4 (calc) (ref 1.0–2.5)
ALT: 8 U/L (ref 6–29)
AST: 10 U/L (ref 10–30)
Albumin: 4.2 g/dL (ref 3.6–5.1)
Alkaline phosphatase (APISO): 71 U/L (ref 31–125)
BUN: 13 mg/dL (ref 7–25)
CO2: 25 mmol/L (ref 20–32)
Calcium: 9.3 mg/dL (ref 8.6–10.2)
Chloride: 107 mmol/L (ref 98–110)
Creat: 0.85 mg/dL (ref 0.50–0.99)
Globulin: 3 g/dL (calc) (ref 1.9–3.7)
Glucose, Bld: 91 mg/dL (ref 65–99)
Potassium: 4.1 mmol/L (ref 3.5–5.3)
Sodium: 140 mmol/L (ref 135–146)
Total Bilirubin: 0.3 mg/dL (ref 0.2–1.2)
Total Protein: 7.2 g/dL (ref 6.1–8.1)
eGFR: 88 mL/min/{1.73_m2} (ref >=60)

## 2021-07-29 LAB — HEMOGLOBIN A1C
Hgb A1c MFr Bld: 6.3 % of total Hgb — ABNORMAL HIGH (ref <5.7)
Mean Plasma Glucose: 134 mg/dL
eAG (mmol/L): 7.4 mmol/L

## 2021-07-29 LAB — LIPID PANEL
Cholesterol: 199 mg/dL (ref <200)
HDL: 59 mg/dL (ref >=50)
LDL Cholesterol (Calc): 120 mg/dL (calc) — ABNORMAL HIGH
Non-HDL Cholesterol (Calc): 140 mg/dL (calc) — ABNORMAL HIGH (ref <130)
Total CHOL/HDL Ratio: 3.4 (calc) (ref <5.0)
Triglycerides: 98 mg/dL (ref <150)

## 2021-07-29 MED ORDER — ATORVASTATIN CALCIUM 20 MG PO TABS: 20.0000 mg | ORAL_TABLET | Freq: Every day | ORAL | 1 refills | Status: DC

## 2021-07-29 NOTE — Telephone Encounter (Signed)
Pt advised.  RX sent to [facility].  Apt scheduled for 11/01/2021 ?

## 2021-07-29 NOTE — Telephone Encounter (Signed)
-----   Message from Jearld Fenton, NP sent at 07/29/2021 11:33 AM EST ----- ?Cholesterol is better but still not at goal.  Would you be willing to increase atorvastatin to 20 mg?  If so, please change this on the [facility].  Liver and kidney function is normal.  A1c is 6.3.  She is getting closer to being diabetic.  She should consume a low saturated fat, low carb diet and exercise for weight loss.  I would like to see her back in 3 months for follow-up. ? ?

## 2021-07-29 NOTE — Telephone Encounter (Signed)
-----   Message from Regina W Baity, NP sent at 07/29/2021 11:33 AM EST ----- ?Cholesterol is better but still not at goal.  Would you be willing to increase atorvastatin to 20 mg?  If so, please change this on the MAR.  Liver and kidney function is normal.  A1c is 6.3.  She is getting closer to being diabetic.  She should consume a low saturated fat, low carb diet and exercise for weight loss.  I would like to see her back in 3 months for follow-up. ? ?

## 2021-08-05 ENCOUNTER — Ambulatory Visit: Payer: Self-pay | Admitting: Psychiatry

## 2021-09-08 ENCOUNTER — Ambulatory Visit: Payer: Medicaid Other | Admitting: Internal Medicine

## 2021-09-12 ENCOUNTER — Ambulatory Visit: Payer: Medicaid Other | Admitting: Internal Medicine

## 2021-09-15 ENCOUNTER — Encounter: Payer: Self-pay | Admitting: Internal Medicine

## 2021-09-15 ENCOUNTER — Ambulatory Visit: Payer: Medicaid Other | Admitting: Internal Medicine

## 2021-09-15 VITALS — BP 126/84 | HR 95 | Ht 65.0 in | Wt 197.2 lb

## 2021-09-15 DIAGNOSIS — B9789 Other viral agents as the cause of diseases classified elsewhere: Secondary | ICD-10-CM | POA: Diagnosis not present

## 2021-09-15 DIAGNOSIS — J329 Chronic sinusitis, unspecified: Secondary | ICD-10-CM

## 2021-09-15 MED ORDER — PREDNISONE 10 MG PO TABS: ORAL_TABLET | ORAL | 0 refills | Status: DC

## 2021-09-15 NOTE — Patient Instructions (Signed)

## 2021-09-15 NOTE — Progress Notes (Signed)
? ?Subjective:  ? ? Patient ID: Meagan Medina, female    DOB: Dec 25, 1980, 41 y.o.   MRN: [id number] ? ?HPI ? ?Patient presents to clinic today with complaint of headache, eye itching, eye watering, ear fullness, runny nose, nasal congestion, scratchy throat and cough. This started 1 week ago. The headache is located in her forehead, she describes the pain as pressure. She denies ear drainage or pain. She is not blowing much mucous out of her nose. She denies difficulty swallowing. The cough is nonproductive. She denies fever, chills or body aches.  She has tried Tylenol Sinus and an antihistamine OTC with minimal relief of symptoms.  She has not had sick contacts with similar symptoms. ? ?Review of Systems ? ?Past Medical History:  ?Diagnosis Date  ? Abnormal Pap smear of cervix   ? Depression   ? GERD (gastroesophageal reflux disease)   ? ? ?Current Outpatient Medications  ?Medication Sig Dispense Refill  ? ARIPiprazole (ABILIFY) 5 MG tablet Take 1 tablet  by mouth daily. 30 tablet 0  ? atorvastatin (LIPITOR) 20 MG tablet Take 1 tablet (20 mg total) by mouth daily. 90 tablet 1  ? famotidine (PEPCID) 10 MG tablet Take 1 tablet (10 mg total) by mouth 2 (two) times daily. 180 tablet 1  ? HYDROcodone-acetaminophen (NORCO/VICODIN) 5-325 MG tablet Take 1 tablet by mouth every 6 (six) hours as needed for moderate pain. 15 tablet 0  ? naproxen (EC NAPROSYN) 500 MG EC tablet Take 1 tablet (500 mg total) by mouth 2 (two) times daily with a meal. 60 tablet 2  ? ?No current facility-administered medications for this visit.  ? ? ?Allergies  ?Allergen Reactions  ? Ibuprofen Nausea And Vomiting and Other (See Comments)  ? ? ?Family History  ?Problem Relation Age of Onset  ? Breast cancer Neg Hx   ? ? ?Social History  ? ?Socioeconomic History  ? Marital status: Single  ?  Spouse name: Not on file  ? Number of children: Not on file  ? Years of education: Not on file  ? Highest education level: Not on file  ?Occupational History  ?  Not on file  ?Tobacco Use  ? Smoking status: Former  ?  Years: 1.00  ?  Types: Cigarettes  ?  Quit date: 2003  ?  Years since quitting: 20.3  ? Smokeless tobacco: Never  ?Vaping Use  ? Vaping Use: Never used  ?Substance and Sexual Activity  ? Alcohol use: Not Currently  ? Drug use: Never  ? Sexual activity: Yes  ?  Partners: Male  ?  Birth control/protection: Condom  ?Other Topics Concern  ? Not on file  ?Social History Narrative  ? Not on file  ? ?Social Determinants of Health  ? ?Financial Resource Strain: Not on file  ?Food Insecurity: Not on file  ?Transportation Needs: Not on file  ?Physical Activity: Not on file  ?Stress: Not on file  ?Social Connections: Not on file  ?Intimate Partner Violence: Not on file  ? ? ? ?Constitutional: Patient reports headache.  Denies fever, malaise, fatigue, or abrupt weight changes.  ?HEENT: Patient reports facial pain and pressure, runny nose, nasal congestion, ear fullness, eye itching/watering and sore throat.  Denies eye pain, eye redness, ear pain, ringing in the ears, wax buildup, bloody nose. ?Respiratory: Patient reports cough.  Denies difficulty breathing, shortness of breath, or sputum production.   ?Cardiovascular: Denies chest pain, chest tightness, palpitations or swelling in the hands or feet.  ? ?  No other specific complaints in a complete review of systems (except as listed in HPI above). ? ?   ?Objective:  ? Physical Exam ? ? ?BP 126/84   Pulse 95   Ht 5\' 5"  (1.651 m)   Wt 197 lb 3.2 oz (89.4 kg)   SpO2 99%   BMI 32.82 kg/m?  ? ?Wt Readings from Last 3 Encounters:  ?07/28/21 197 lb (89.4 kg)  ?04/26/21 199 lb 3.2 oz (90.4 kg)  ?03/16/21 196 lb (88.9 kg)  ? ? ?General: Appears her stated age, in NAD. ?HEENT: Head: normal shape and size, frontal sinus tenderness noted; Eyes: sclera white, no icterus, conjunctiva pink, PERRLA and EOMs intact; Ears: Tm's gray and intact, normal light reflex; Nose: mucosa pink and moist, septum midline; Throat/Mouth: Teeth  present, mucosa pink and moist, no exudate, lesions or ulcerations noted.  ?Neck:  No adenopathy noted. ?Cardiovascular: Normal rate and rhythm. S1,S2 noted.  No murmur, rubs or gallops noted. ?Pulmonary/Chest: Normal effort and positive vesicular breath sounds. No respiratory distress. No wheezes, rales or ronchi noted.  ?Neurological: Alert and oriented.  ? ?BMET ?   ?Component Value Date/Time  ? NA 140 07/28/2021 1100  ? NA 136 12/22/2012 1242  ? K 4.1 07/28/2021 1100  ? K 3.7 12/22/2012 1242  ? CL 107 07/28/2021 1100  ? CL 107 12/22/2012 1242  ? CO2 25 07/28/2021 1100  ? CO2 23 12/22/2012 1242  ? GLUCOSE 91 07/28/2021 1100  ? GLUCOSE 89 12/22/2012 1242  ? BUN 13 07/28/2021 1100  ? BUN 10 12/22/2012 1242  ? CREATININE 0.85 07/28/2021 1100  ? CALCIUM 9.3 07/28/2021 1100  ? CALCIUM 9.7 12/22/2012 1242  ? GFRNONAA >60 01/25/2021 0809  ? GFRNONAA >60 12/22/2012 1242  ? GFRAA >60 12/22/2012 1242  ? ? ?Lipid Panel  ?   ?Component Value Date/Time  ? CHOL 199 07/28/2021 1100  ? TRIG 98 07/28/2021 1100  ? HDL 59 07/28/2021 1100  ? CHOLHDL 3.4 07/28/2021 1100  ? LDLCALC 120 (H) 07/28/2021 1100  ? ? ?CBC ?   ?Component Value Date/Time  ? WBC 5.3 03/16/2021 0958  ? RBC 5.04 03/16/2021 0958  ? HGB 10.9 (L) 03/16/2021 0958  ? HGB 11.4 (L) 07/04/2013 1120  ? HCT 36.3 03/16/2021 0958  ? HCT 32.1 (L) 07/08/2013 07/10/2013  ? PLT 329 03/16/2021 0958  ? PLT 264 07/04/2013 1120  ? MCV 72.0 (L) 03/16/2021 0958  ? MCV 74 (L) 07/04/2013 1120  ? MCH 21.6 (L) 03/16/2021 03/18/2021  ? MCHC 30.0 (L) 03/16/2021 0958  ? RDW 16.7 (H) 03/16/2021 03/18/2021  ? RDW 15.5 (H) 07/04/2013 1120  ? LYMPHSABS 1.0 07/04/2013 1120  ? MONOABS 0.6 07/04/2013 1120  ? EOSABS 0.0 07/04/2013 1120  ? BASOSABS 0.0 07/04/2013 1120  ? ? ?Hgb A1C ?Lab Results  ?Component Value Date  ? HGBA1C 6.3 (H) 07/28/2021  ? ? ? ? ? ? ?   ?Assessment & Plan:  ? ? ?Viral Sinusitis:  ? ?Can use a Nettie pot which can be purchased from your local pharmacy  ?Rx for Pred taper x 6 days  ?Continue  oral antihistamine as needed  ? ?Return precautions discussed, RTC in 5 months for your annual exam ?09/27/2021, NP ? ?

## 2021-09-19 ENCOUNTER — Ambulatory Visit (INDEPENDENT_AMBULATORY_CARE_PROVIDER_SITE_OTHER): Payer: No Typology Code available for payment source | Admitting: Psychiatry

## 2021-09-19 DIAGNOSIS — Z008 Encounter for other general examination: Secondary | ICD-10-CM

## 2021-09-19 NOTE — Progress Notes (Signed)
Patient had connection problem and was advised to reschedule.  I have communicated with staff to contact patient to reschedule this appointment ?

## 2021-10-20 ENCOUNTER — Ambulatory Visit: Payer: No Typology Code available for payment source | Admitting: Psychiatry

## 2021-10-26 ENCOUNTER — Other Ambulatory Visit: Payer: Self-pay | Admitting: Internal Medicine

## 2021-10-26 NOTE — Telephone Encounter (Signed)
Called pharmacy - S/w Meagan Medina pt has 90 day refill available for refill. He will refill.

## 2021-10-26 NOTE — Telephone Encounter (Signed)
Called pt to let her know that refill is being prepared by pharmacy now.

## 2021-10-26 NOTE — Telephone Encounter (Signed)
Medication Refill - Medication:  atorvastatin (LIPITOR) 20 MG tablet  Has the patient contacted their pharmacy? Yes.   Contact PCP  Preferred Pharmacy (with phone number or street name):  [facility] [address] ([address]), [place] - [address]  [address]) [place] [address]  Phone:  [phone]  Fax:  [phone]   Has the patient been seen for an appointment in the last year OR does the patient have an upcoming appointment? Yes.    Agent: Please be advised that RX refills may take up to 3 business days. We ask that you follow-up with your pharmacy.

## 2021-10-26 NOTE — Telephone Encounter (Signed)
Pt has 90 day refill that is being prepared for pick up today. Spoke with pharmacy and pt. Requested Prescriptions  Pending Prescriptions Disp Refills  . atorvastatin (LIPITOR) 20 MG tablet 90 tablet 1    Sig: Take 1 tablet (20 mg total) by mouth daily.     Cardiovascular:  Antilipid - Statins Failed - 10/26/2021  3:54 PM      Failed - Lipid Panel in normal range within the last 12 months    Cholesterol  Date Value Ref Range Status  07/28/2021 199 <200 mg/dL Final   LDL Cholesterol (Calc)  Date Value Ref Range Status  07/28/2021 120 (H) mg/dL (calc) Final    Comment:    Reference range: <100 . Desirable range <100 mg/dL for primary prevention;   <70 mg/dL for patients with CHD or diabetic patients  with > or = 2 CHD risk factors. Marland Kitchen LDL-C is now calculated using the Martin-Hopkins  calculation, which is a validated novel method providing  better accuracy than the Friedewald equation in the  estimation of LDL-C.  Cresenciano Genre et al. Annamaria Helling. WG:[id number]): 2061-2068  (http://education.[facility].com/faq/FAQ164)    HDL  Date Value Ref Range Status  07/28/2021 59 > OR = 50 mg/dL Final   Triglycerides  Date Value Ref Range Status  07/28/2021 98 <150 mg/dL Final         Passed - Patient is not pregnant      Passed - Valid encounter within last 12 months    Recent Outpatient Visits          1 month ago Viral sinusitis   [facility] [place], Coralie Keens, NP   3 months ago Prediabetes   [facility] [place], [place] W, NP   6 months ago Severe episode of recurrent major depressive disorder, with psychotic features [facility])   [facility], Coralie Keens, NP   7 months ago Encounter for general adult medical examination with abnormal findings   [facility] [place], Coralie Keens, NP   1 year ago Encounter to establish care with new doctor   [facility], Lupita Raider, FNP      Future Appointments            In  1 week Baity, Coralie Keens, NP [facility], [facility]

## 2021-11-01 ENCOUNTER — Ambulatory Visit: Payer: Medicaid Other | Admitting: Internal Medicine

## 2021-11-02 ENCOUNTER — Encounter: Payer: Self-pay | Admitting: Internal Medicine

## 2021-11-02 ENCOUNTER — Ambulatory Visit: Payer: Medicaid Other | Admitting: Internal Medicine

## 2021-11-02 VITALS — BP 108/76 | HR 87 | Temp 97.1°F | Wt 196.0 lb

## 2021-11-02 DIAGNOSIS — E782 Mixed hyperlipidemia: Secondary | ICD-10-CM

## 2021-11-02 DIAGNOSIS — F39 Unspecified mood [affective] disorder: Secondary | ICD-10-CM

## 2021-11-02 DIAGNOSIS — D500 Iron deficiency anemia secondary to blood loss (chronic): Secondary | ICD-10-CM | POA: Diagnosis not present

## 2021-11-02 DIAGNOSIS — M1651 Unilateral post-traumatic osteoarthritis, right hip: Secondary | ICD-10-CM

## 2021-11-02 DIAGNOSIS — K219 Gastro-esophageal reflux disease without esophagitis: Secondary | ICD-10-CM

## 2021-11-02 DIAGNOSIS — L309 Dermatitis, unspecified: Secondary | ICD-10-CM | POA: Diagnosis not present

## 2021-11-02 DIAGNOSIS — R7303 Prediabetes: Secondary | ICD-10-CM | POA: Diagnosis not present

## 2021-11-02 DIAGNOSIS — Z6832 Body mass index (BMI) 32.0-32.9, adult: Secondary | ICD-10-CM

## 2021-11-02 DIAGNOSIS — E6609 Other obesity due to excess calories: Secondary | ICD-10-CM

## 2021-11-02 MED ORDER — PREDNISONE 10 MG PO TABS: ORAL_TABLET | ORAL | 0 refills | Status: DC

## 2021-11-02 NOTE — Assessment & Plan Note (Signed)
Encourage diet and exercise for weight loss 

## 2021-11-02 NOTE — Assessment & Plan Note (Signed)
Avoid foods that trigger reflux Encourage weight loss as this can help reduce reflux symptoms Continue famotidine 

## 2021-11-02 NOTE — Assessment & Plan Note (Signed)
C-Met and lipid profile today Encouraged her to consume a low-fat diet Continue Atorvastatin

## 2021-11-02 NOTE — Assessment & Plan Note (Signed)
Continue Abilify per psychiatry Encouraged her to continue to meet with her therapist

## 2021-11-02 NOTE — Assessment & Plan Note (Signed)
A1c today Encourage low-carb diet and exercise for weight loss 

## 2021-11-02 NOTE — Patient Instructions (Signed)
Eczema Eczema refers to a group of skin conditions that cause skin to become rough and inflamed. Each type of eczema has different triggers, symptoms, and treatments. Eczema of any type is usually itchy. Symptoms range from mild to severe. Eczema is not spread from person to person (is not contagious). It can appear on different parts of the body at different times. One person's eczema may look different from another person's eczema. What are the causes? The exact cause of this condition is not known. However, exposure to certain environmental factors, irritants, and allergens can make the condition worse. What are the signs or symptoms? Symptoms of this condition depend on the type of eczema you have. The types include: Contact dermatitis. There are two kinds: Irritant contact dermatitis. This happens when something irritates the skin and causes a rash. Allergic contact dermatitis. This happens when your skin comes in contact with something you are allergic to (allergens). This can include poison ivy, chemicals, or medicines that were applied to your skin. Atopic dermatitis. This is a long-term (chronic) skin disease that keeps coming back (recurring). It is the most common type of eczema. Usual symptoms are a red rash and itchy, dry, scaly skin. It usually starts showing signs in infancy and can last through adulthood. Dyshidrotic eczema. This is a form of eczema on the hands and feet. It shows up as very itchy, fluid-filled blisters. It can affect people of any age but is more common before age 40. Hand eczema. This causes very itchy areas of skin on the palms and sides of the hands and fingers. This type of eczema is common in industrial jobs where you may be exposed to different types of irritants. Lichen simplex chronicus. This type of eczema occurs when a person constantly scratches one area of the body. Repeated scratching of the area leads to thickened skin (lichenification). This condition can  accompany other types of eczema. It is more common in adults but may also be seen in children. Nummular eczema. This is a common type of eczema that most often affects the lower legs and the backs of the hands. It typically causes an itchy, red, circular, crusty lesion (plaque). Scratching may become a habit and can cause bleeding. Nummular eczema occurs most often in middle-aged or older people. Seborrheic dermatitis. This is a common skin disease that mainly affects the scalp. It may also affect other oily areas of the body, such as the face, sides of the nose, eyebrows, ears, eyelids, and chest. It is marked by small scaling and redness of the skin (erythema). This can affect people of all ages. In infants, this condition is called cradle cap. Stasis dermatitis. This is a common skin disease that can cause itching, scaling, and hyperpigmentation, usually on the legs and feet. It occurs most often in people who have a condition that prevents blood from being pumped through the veins in the legs (chronic venous insufficiency). Stasis dermatitis is a chronic condition that needs long-term management. How is this diagnosed? This condition may be diagnosed based on: A physical exam of your skin. Your medical history. Skin patch tests. These tests involve using patches that contain possible allergens and placing them on your back. Your health care provider will check in a few days to see if an allergic reaction occurred. How is this treated? Treatment for eczema is based on the type of eczema you have. You may be given hydrocortisone steroid medicine or antihistamines. These can relieve itching quickly and help reduce inflammation.   These may be prescribed or purchased over the counter, depending on the strength that is needed. Follow these instructions at home: Take or apply over-the-counter and prescription medicines only as told by your health care provider. Use creams or ointments to moisturize your  skin. Do not use lotions. Learn what triggers or irritates your symptoms so you can avoid these things. Treat symptom flare-ups quickly. Do not scratch your skin. This can make your rash worse. Keep all follow-up visits. This is important. Where to find more information American Academy of Dermatology: aad.org National Eczema Association: nationaleczema.org The Society for Pediatric Dermatology: pedsderm.net Contact a health care provider if: You have severe itching, even with treatment. You scratch your skin regularly until it bleeds. Your rash looks different than usual. Your skin is painful, swollen, or more red than usual. You have a fever. Summary Eczema refers to a group of skin conditions that cause skin to become rough and inflamed. Each type has different triggers. Eczema of any type causes itching that may range from mild to severe. Treatment varies based on the type of eczema you have. Hydrocortisone steroid medicine or antihistamines can help with itching and inflammation. Protecting your skin is the best way to prevent eczema. Use creams or ointments to moisturize your skin. Avoid triggers and irritants. Treat flare-ups quickly. This information is not intended to replace advice given to you by your health care provider. Make sure you discuss any questions you have with your health care provider. Document Revised: 02/16/2020 Document Reviewed: 02/16/2020 Elsevier Patient Education  2023 Elsevier Inc.  

## 2021-11-02 NOTE — Assessment & Plan Note (Signed)
Encourage weight loss as this can help reduce joint pain Continue naproxen as needed

## 2021-11-02 NOTE — Progress Notes (Signed)
Subjective:    Patient ID: Meagan Medina, female    DOB: 1981-02-15, 41 y.o.   MRN: [id number]  HPI  Patient presents to clinic today for 35-month follow-up of chronic conditions.  HLD: Her last LDL was 120, triglycerides 98, 07/2021.  Her Atorvastatin was increased to 20 mg daily at her last visit.  She does not consume a low-fat diet.  Prediabetes: Her last A1c was 6.3%, 07/2021.  She is not taking any oral diabetic medication at this time.  She does not check her sugars.  GERD: Triggered by coffee.  She denies breakthrough on Famotidine.  There is no upper GI on file.  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  She takes Naproxen as needed with good relief of symptoms.  She does not follow with orthopedics.  Anemia: Her last H/H was 10.9/36.3, 02/2021.  She is not currently taking any oral iron.  She does not follow with hematology.  Mood Disorder: Chronic, managed on Abilify.  She is following with a therapist and psychiatry.  She denies SI/HI.  She also reports a rash of her neck.  She noticed this 1 month ago. She reports it itches. It has spread to her right breast. She has tried Calamine lotion, cold showers with minimal relief of symptoms. She did recently changed deodoerants but denies changes in soaps, lotions, diet or medication.  Review of Systems     Past Medical History:  Diagnosis Date   Abnormal Pap smear of cervix    Depression    GERD (gastroesophageal reflux disease)     Current Outpatient Medications  Medication Sig Dispense Refill   ARIPiprazole (ABILIFY) 5 MG tablet Take 1 tablet  by mouth daily. 30 tablet 0   atorvastatin (LIPITOR) 20 MG tablet Take 1 tablet (20 mg total) by mouth daily. 90 tablet 1   famotidine (PEPCID) 10 MG tablet Take 1 tablet (10 mg total) by mouth 2 (two) times daily. 180 tablet 1   HYDROcodone-acetaminophen (NORCO/VICODIN) 5-325 MG tablet Take 1 tablet by mouth every 6 (six) hours as needed for moderate pain. 15 tablet 0   naproxen (EC  NAPROSYN) 500 MG EC tablet Take 1 tablet (500 mg total) by mouth 2 (two) times daily with a meal. 60 tablet 2   predniSONE (DELTASONE) 10 MG tablet Take 6 tabs on day 1, 5 tabs on day 2, 4 tabs on day 3, 3 tabs on day 4, 2 tabs on day 5, 1 tab on day 6 21 tablet 0   No current facility-administered medications for this visit.    Allergies  Allergen Reactions   Ibuprofen Nausea And Vomiting and Other (See Comments)    Family History  Problem Relation Age of Onset   Breast cancer Neg Hx     Social History   Socioeconomic History   Marital status: Single    Spouse name: Not on file   Number of children: Not on file   Years of education: Not on file   Highest education level: Not on file  Occupational History   Not on file  Tobacco Use   Smoking status: Former    Years: 1.00    Types: Cigarettes    Quit date: 2003    Years since quitting: 20.4   Smokeless tobacco: Never  Vaping Use   Vaping Use: Never used  Substance and Sexual Activity   Alcohol use: Not Currently   Drug use: Never   Sexual activity: Yes    Partners: Male  Birth control/protection: Condom  Other Topics Concern   Not on file  Social History Narrative   Not on file   Social Determinants of Health   Financial Resource Strain: Not on file  Food Insecurity: Not on file  Transportation Needs: Not on file  Physical Activity: Not on file  Stress: Not on file  Social Connections: Not on file  Intimate Partner Violence: Not on file     Constitutional: Denies fever, malaise, fatigue, headache or abrupt weight changes.  HEENT: Denies eye pain, eye redness, ear pain, ringing in the ears, wax buildup, runny nose, nasal congestion, bloody nose, or sore throat. Respiratory: Denies difficulty breathing, shortness of breath, cough or sputum production.   Cardiovascular: Denies chest pain, chest tightness, palpitations or swelling in the hands or feet.  Gastrointestinal: Denies abdominal pain, bloating,  constipation, diarrhea or blood in the stool.  GU: Denies urgency, frequency, pain with urination, burning sensation, blood in urine, odor or discharge. Musculoskeletal: Patient reports joint pain.  Denies decrease in range of motion, difficulty with gait, muscle pain or joint swelling.  Skin: Pt reports rash. Denies redness, lesions or ulcercations.  Neurological: Denies dizziness, difficulty with memory, difficulty with speech or problems with balance and coordination.  Psych: Patient has a history of anxiety and depression.  Denies SI/HI.  No other specific complaints in a complete review of systems (except as listed in HPI above).  Objective:   Physical Exam  BP 108/76 (BP Location: Left Arm, Patient Position: Sitting, Cuff Size: Large)   Pulse 87   Temp (!) 97.1 F (36.2 C) (Temporal)   Wt 196 lb (88.9 kg)   SpO2 99%   BMI 32.62 kg/m   Wt Readings from Last 3 Encounters:  09/15/21 197 lb 3.2 oz (89.4 kg)  07/28/21 197 lb (89.4 kg)  04/26/21 199 lb 3.2 oz (90.4 kg)    General: Appears her stated age, obese in NAD. Skin: Warm, dry and intact.  Grouped maculopapular hyperpigmented rash noted of bilateral neck, right upper breast. HEENT: Head: normal shape and size; Eyes: sclera white, no icterus, conjunctiva pink, PERRLA and EOMs intact;  Cardiovascular: Normal rate and rhythm. S1,S2 noted.  No murmur, rubs or gallops noted. No JVD or BLE edema.  Pulmonary/Chest: Normal effort and positive vesicular breath sounds. No respiratory distress. No wheezes, rales or ronchi noted.  Abdomen: Soft and nontender. Normal bowel sounds.  Musculoskeletal:  No difficulty with gait.  Not using her rolling walker today. Neurological: Alert and oriented. Psychiatric: Mood and affect flat. Behavior is normal. Judgment and thought content normal.    BMET    Component Value Date/Time   NA 140 07/28/2021 1100   NA 136 12/22/2012 1242   K 4.1 07/28/2021 1100   K 3.7 12/22/2012 1242   CL 107  07/28/2021 1100   CL 107 12/22/2012 1242   CO2 25 07/28/2021 1100   CO2 23 12/22/2012 1242   GLUCOSE 91 07/28/2021 1100   GLUCOSE 89 12/22/2012 1242   BUN 13 07/28/2021 1100   BUN 10 12/22/2012 1242   CREATININE 0.85 07/28/2021 1100   CALCIUM 9.3 07/28/2021 1100   CALCIUM 9.7 12/22/2012 1242   GFRNONAA >60 01/25/2021 0809   GFRNONAA >60 12/22/2012 1242   GFRAA >60 12/22/2012 1242    Lipid Panel     Component Value Date/Time   CHOL 199 07/28/2021 1100   TRIG 98 07/28/2021 1100   HDL 59 07/28/2021 1100   CHOLHDL 3.4 07/28/2021 1100   LDLCALC  120 (H) 07/28/2021 1100    CBC    Component Value Date/Time   WBC 5.3 03/16/2021 0958   RBC 5.04 03/16/2021 0958   HGB 10.9 (L) 03/16/2021 0958   HGB 11.4 (L) 07/04/2013 1120   HCT 36.3 03/16/2021 0958   HCT 32.1 (L) 07/08/2013 0616   PLT 329 03/16/2021 0958   PLT 264 07/04/2013 1120   MCV 72.0 (L) 03/16/2021 0958   MCV 74 (L) 07/04/2013 1120   MCH 21.6 (L) 03/16/2021 0958   MCHC 30.0 (L) 03/16/2021 0958   RDW 16.7 (H) 03/16/2021 0958   RDW 15.5 (H) 07/04/2013 1120   LYMPHSABS 1.0 07/04/2013 1120   MONOABS 0.6 07/04/2013 1120   EOSABS 0.0 07/04/2013 1120   BASOSABS 0.0 07/04/2013 1120    Hgb A1C Lab Results  Component Value Date   HGBA1C 6.3 (H) 07/28/2021           Assessment & Plan:  Rash:  Consistent with eczema Rx for Pred taper x9 days Encourage moisturizing lotion twice daily Referral to dermatology for further evaluation and treatment  RTC in 4 months for annual exam Nicki Reaper, NP

## 2021-11-02 NOTE — Assessment & Plan Note (Signed)
CBC and iron panel today 

## 2021-11-03 LAB — COMPLETE METABOLIC PANEL WITH GFR
AG Ratio: 1.4 (calc) (ref 1.0–2.5)
ALT: 11 U/L (ref 6–29)
AST: 11 U/L (ref 10–30)
Albumin: 4.3 g/dL (ref 3.6–5.1)
Alkaline phosphatase (APISO): 75 U/L (ref 31–125)
BUN: 12 mg/dL (ref 7–25)
CO2: 24 mmol/L (ref 20–32)
Calcium: 9.5 mg/dL (ref 8.6–10.2)
Chloride: 105 mmol/L (ref 98–110)
Creat: 0.84 mg/dL (ref 0.50–0.99)
Globulin: 3.1 g/dL (calc) (ref 1.9–3.7)
Glucose, Bld: 102 mg/dL — ABNORMAL HIGH (ref 65–99)
Potassium: 4.2 mmol/L (ref 3.5–5.3)
Sodium: 139 mmol/L (ref 135–146)
Total Bilirubin: 0.3 mg/dL (ref 0.2–1.2)
Total Protein: 7.4 g/dL (ref 6.1–8.1)
eGFR: 88 mL/min/{1.73_m2} (ref >=60)

## 2021-11-03 LAB — HEMOGLOBIN A1C
Hgb A1c MFr Bld: 6.4 % of total Hgb — ABNORMAL HIGH (ref <5.7)
Mean Plasma Glucose: 137 mg/dL
eAG (mmol/L): 7.6 mmol/L

## 2021-11-03 LAB — CBC
HCT: 34.6 % — ABNORMAL LOW (ref 35.0–45.0)
Hemoglobin: 10.8 g/dL — ABNORMAL LOW (ref 11.7–15.5)
MCH: 21.7 pg — ABNORMAL LOW (ref 27.0–33.0)
MCHC: 31.2 g/dL — ABNORMAL LOW (ref 32.0–36.0)
MCV: 69.6 fL — ABNORMAL LOW (ref 80.0–100.0)
MPV: 10.9 fL (ref 7.5–12.5)
Platelets: 299 10*3/uL (ref 140–400)
RBC: 4.97 10*6/uL (ref 3.80–5.10)
RDW: 18.3 % — ABNORMAL HIGH (ref 11.0–15.0)
WBC: 7.1 10*3/uL (ref 3.8–10.8)

## 2021-11-03 LAB — LIPID PANEL
Cholesterol: 176 mg/dL (ref <200)
HDL: 53 mg/dL (ref >=50)
LDL Cholesterol (Calc): 99 mg/dL (calc)
Non-HDL Cholesterol (Calc): 123 mg/dL (calc) (ref <130)
Total CHOL/HDL Ratio: 3.3 (calc) (ref <5.0)
Triglycerides: 143 mg/dL (ref <150)

## 2021-11-03 LAB — IRON,TIBC AND FERRITIN PANEL
%SAT: 12 % (calc) — ABNORMAL LOW (ref 16–45)
Ferritin: 8 ng/mL — ABNORMAL LOW (ref 16–232)
Iron: 45 ug/dL (ref 40–190)
TIBC: 388 mcg/dL (calc) (ref 250–450)

## 2021-11-10 ENCOUNTER — Ambulatory Visit: Payer: Medicaid Other | Admitting: Advanced Practice Midwife

## 2021-11-10 ENCOUNTER — Encounter: Payer: Self-pay | Admitting: Advanced Practice Midwife

## 2021-11-10 DIAGNOSIS — Z113 Encounter for screening for infections with a predominantly sexual mode of transmission: Secondary | ICD-10-CM | POA: Diagnosis not present

## 2021-11-10 LAB — HM HIV SCREENING LAB: HM HIV Screening: NEGATIVE

## 2021-11-10 LAB — HM HEPATITIS C SCREENING LAB: HM Hepatitis Screen: NEGATIVE

## 2021-11-10 LAB — WET PREP FOR TRICH, YEAST, CLUE
Trichomonas Exam: NEGATIVE
Yeast Exam: NEGATIVE

## 2021-11-10 NOTE — Progress Notes (Signed)
WET PREP negative and no treatment indicated. Pt verbalized understanding of further labwork pending. Condoms declined. Lethea Killings RN

## 2021-11-10 NOTE — Progress Notes (Signed)
[facility] Department  STI clinic/screening visit [address] [place] [address] [phone]  Subjective:  Meagan Medina is a 41 y.o. SBF exsmoker G1P1 female being seen today for an STI screening visit. The patient reports they do have symptoms.  Patient reports that they do not desire a pregnancy in the next year.   They reported they are not interested in discussing contraception today.    Patient's last menstrual period was 11/03/2021 (approximate).   Patient has the following medical conditions:   Patient Active Problem List   Diagnosis Date Noted   HLD (hyperlipidemia) 04/26/2021   Mood disorder (HCC) 04/26/2021   Prediabetes 03/16/2021   Anemia 03/16/2021   Osteoarthritis 03/16/2021   Class 1 obesity due to excess calories with body mass index (BMI) of 32.0 to 32.9 in adult 03/16/2021   Gastroesophageal reflux disease 05/27/2020    Chief Complaint  Patient presents with   SEXUALLY TRANSMITTED DISEASE    Screening    HPI  Patient reports c/o internal and external itching and burning x couple days. LMP 11/03/21. Last sex 2022 without condom. Last ETOH 09/08/21 (1 beer) rarely. Last MJ age 71. Last cig 2003.   Last HIV test per patient/review of record was 03/16/21 Patient reports last pap was 2021 neg per pt  Screening for MPX risk: Does the patient have an unexplained rash? No Is the patient MSM? No Does the patient endorse multiple sex partners or anonymous sex partners? No Did the patient have close or sexual contact with a person diagnosed with MPX? No Has the patient traveled outside the [place] where MPX is endemic? No Is there a high clinical suspicion for MPX-- evidenced by one of the following No  -Unlikely to be chickenpox  -Lymphadenopathy  -Rash that present in same phase of evolution on any given body part See flowsheet for further details and programmatic requirements.   Immunization history:  Immunization History   Administered Date(s) Administered   Hep A / Hep B 08/03/2008, 09/22/2008, 08/20/2013   Tdap 05/27/2020     The following portions of the patient's history were reviewed and updated as appropriate: allergies, current medications, past medical history, past social history, past surgical history and problem list.  Objective:  There were no vitals filed for this visit.  Physical Exam Vitals and nursing note reviewed.  Constitutional:      Appearance: Normal appearance. She is obese.  HENT:     Head: Normocephalic and atraumatic.     Mouth/Throat:     Mouth: Mucous membranes are moist.     Pharynx: Oropharynx is clear. No oropharyngeal exudate or posterior oropharyngeal erythema.  Eyes:     Conjunctiva/sclera: Conjunctivae normal.  Pulmonary:     Effort: Pulmonary effort is normal.  Abdominal:     Palpations: Abdomen is soft. There is no mass.     Tenderness: There is no abdominal tenderness. There is no rebound.     Comments: Soft without masses or tenderness, poor tone  Genitourinary:    General: Normal vulva.     Exam position: Lithotomy position.     Pubic Area: No rash or pubic lice.      Labia:        Right: No rash or lesion.        Left: No rash or lesion.      Vagina: Vaginal discharge (grey leukorrhea, ph>4.5) present. No erythema, bleeding or lesions.     Cervix: Normal.  Uterus: Normal.      Adnexa: Right adnexa normal and left adnexa normal.     Rectum: Normal.     Comments: pH = >4.5 Lymphadenopathy:     Head:     Right side of head: No preauricular or posterior auricular adenopathy.     Left side of head: No preauricular or posterior auricular adenopathy.     Cervical: No cervical adenopathy.     Upper Body:     Right upper body: No supraclavicular, axillary or epitrochlear adenopathy.     Left upper body: No supraclavicular, axillary or epitrochlear adenopathy.     Lower Body: No right inguinal adenopathy. No left inguinal adenopathy.  Skin:     General: Skin is warm and dry.     Findings: No rash.  Neurological:     Mental Status: She is alert and oriented to person, place, and time.      Assessment and Plan:  Meagan Medina is a 41 y.o. female presenting to the [facility] Department for STI screening  1. Screening examination for venereal disease Treat wet mount per standing orders Immunization nurse consult  - WET PREP FOR TRICH, YEAST, CLUE - Chlamydia/Gonorrhea [place] [facility] - HIV/HCV [place] [facility] - Syphilis Serology,  Lab     No follow-ups on file.  Future Appointments  Date Time Provider Department Center  11/23/2021  9:00 AM Jomarie Longs, MD [facility] None  02/14/2022  9:40 AM Baity, Salvadore [place], NP [facility]    Alberteen Spindle, CNM

## 2021-11-23 ENCOUNTER — Ambulatory Visit: Payer: No Typology Code available for payment source | Admitting: Psychiatry

## 2022-02-02 ENCOUNTER — Other Ambulatory Visit: Payer: Self-pay | Admitting: Internal Medicine

## 2022-02-03 NOTE — Telephone Encounter (Signed)
Requested Prescriptions  Pending Prescriptions Disp Refills  . atorvastatin (LIPITOR) 20 MG tablet [Pharmacy Med Name: Atorvastatin Calcium 20 MG Oral Tablet] 90 tablet 0    Sig: Take 1 tablet by mouth once daily     Cardiovascular:  Antilipid - Statins Failed - 02/02/2022  9:31 AM      Failed - Lipid Panel in normal range within the last 12 months    Cholesterol  Date Value Ref Range Status  11/02/2021 176 <200 mg/dL Final   LDL Cholesterol (Calc)  Date Value Ref Range Status  11/02/2021 99 mg/dL (calc) Final    Comment:    Reference range: <100 . Desirable range <100 mg/dL for primary prevention;   <70 mg/dL for patients with CHD or diabetic patients  with > or = 2 CHD risk factors. Marland Kitchen LDL-C is now calculated using the Martin-Hopkins  calculation, which is a validated novel method providing  better accuracy than the Friedewald equation in the  estimation of LDL-C.  Horald Pollen et al. Lenox Ahr. 1062;694(85): 2061-2068  (http://education.[facility].com/faq/FAQ164)    HDL  Date Value Ref Range Status  11/02/2021 53 > OR = 50 mg/dL Final   Triglycerides  Date Value Ref Range Status  11/02/2021 143 <150 mg/dL Final         Passed - Patient is not pregnant      Passed - Valid encounter within last 12 months    Recent Outpatient Visits          3 months ago Eczema, unspecified type   [facility] [place], Salvadore Oxford, NP   4 months ago Viral sinusitis   [facility] [place], Salvadore Oxford, NP   6 months ago Prediabetes   [facility] [place], [place] W, NP   9 months ago Severe episode of recurrent major depressive disorder, with psychotic features [facility])   [facility] [place], Salvadore Oxford, NP   10 months ago Encounter for general adult medical examination with abnormal findings   [facility], Salvadore Oxford, NP      Future Appointments            In 1 week Sampson Si, Salvadore Oxford, NP [facility], [facility]

## 2022-02-04 ENCOUNTER — Other Ambulatory Visit: Payer: Self-pay | Admitting: Internal Medicine

## 2022-02-06 NOTE — Telephone Encounter (Signed)
Refilled 02/03/22 # 90.

## 2022-02-14 ENCOUNTER — Encounter: Payer: Self-pay | Admitting: Internal Medicine

## 2022-02-14 ENCOUNTER — Ambulatory Visit: Payer: Medicaid Other | Admitting: Internal Medicine

## 2022-02-14 VITALS — BP 114/80 | HR 85 | Temp 96.9°F | Wt 182.0 lb

## 2022-02-14 DIAGNOSIS — R7303 Prediabetes: Secondary | ICD-10-CM | POA: Diagnosis not present

## 2022-02-14 DIAGNOSIS — D500 Iron deficiency anemia secondary to blood loss (chronic): Secondary | ICD-10-CM | POA: Diagnosis not present

## 2022-02-14 DIAGNOSIS — F39 Unspecified mood [affective] disorder: Secondary | ICD-10-CM

## 2022-02-14 DIAGNOSIS — K219 Gastro-esophageal reflux disease without esophagitis: Secondary | ICD-10-CM | POA: Diagnosis not present

## 2022-02-14 DIAGNOSIS — M1651 Unilateral post-traumatic osteoarthritis, right hip: Secondary | ICD-10-CM

## 2022-02-14 DIAGNOSIS — Z6830 Body mass index (BMI) 30.0-30.9, adult: Secondary | ICD-10-CM

## 2022-02-14 DIAGNOSIS — E782 Mixed hyperlipidemia: Secondary | ICD-10-CM

## 2022-02-14 DIAGNOSIS — E6609 Other obesity due to excess calories: Secondary | ICD-10-CM

## 2022-02-14 LAB — POCT GLYCOSYLATED HEMOGLOBIN (HGB A1C): HbA1c, POC (prediabetic range): 6.3 % (ref 5.7–6.4)

## 2022-02-14 NOTE — Patient Instructions (Signed)

## 2022-02-14 NOTE — Assessment & Plan Note (Signed)
Encourage weight loss as this can help reduce joint pain Continue naproxen as needed

## 2022-02-14 NOTE — Assessment & Plan Note (Signed)
We will have her start oral iron 325 mg daily Discussed taking stool softener as needed if constipation occurs

## 2022-02-14 NOTE — Progress Notes (Signed)
Subjective:    Patient ID: Meagan Medina, female    DOB: [birth year], 41 y.o.   MRN: [id number]  HPI  Patient presents to clinic today for 52-month follow-up of chronic conditions.  HLD: Her last LDL was 99, triglycerides 062, 10/2021.  She denies myalgias on Atorvastatin.  She does not consume a low-fat diet.  Prediabetes: Her last A1c was 6.4%, 10/2021.  She is not taking any oral diabetic medication at this time.  She does not check her sugars.  GERD: Triggered by coffee.  She denies breakthrough on Famotidine.  There is no upper GI on file.  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  She takes Naproxen as needed with good relief of symptoms.  She does not follow with orthopedics.  Anemia: Her last H/H was 10.8/34.6, 10/2021.  She is not currently taking any oral Iron.  She does not follow with hematology.  Anxiety and Depression.: Chronic, managed on Abilify.  She follows with a psychiatrist and her therapist.  She denies SI/HI.  Review of Systems   Past Medical History:  Diagnosis Date   Abnormal Pap smear of cervix    Depression    GERD (gastroesophageal reflux disease)     Current Outpatient Medications  Medication Sig Dispense Refill   ARIPiprazole (ABILIFY) 5 MG tablet Take 1 tablet  by mouth daily. 30 tablet 0   atorvastatin (LIPITOR) 20 MG tablet Take 1 tablet by mouth once daily 90 tablet 0   famotidine (PEPCID) 10 MG tablet Take 1 tablet (10 mg total) by mouth 2 (two) times daily. 180 tablet 1   HYDROcodone-acetaminophen (NORCO/VICODIN) 5-325 MG tablet Take 1 tablet by mouth every 6 (six) hours as needed for moderate pain. (Patient not taking: Reported on 11/10/2021) 15 tablet 0   predniSONE (DELTASONE) 10 MG tablet Take 3 tabs on days 1-3, 2 tabs on days 4-6, 1 tab on days 7-9 18 tablet 0   No current facility-administered medications for this visit.    Allergies  Allergen Reactions   Ibuprofen Nausea And Vomiting and Other (See Comments)    Family History  Problem  Relation Age of Onset   Breast cancer Neg Hx     Social History   Socioeconomic History   Marital status: Single    Spouse name: Not on file   Number of children: Not on file   Years of education: Not on file   Highest education level: Not on file  Occupational History   Not on file  Tobacco Use   Smoking status: Former    Years: 1.00    Types: Cigarettes    Quit date: 2003    Years since quitting: 20.7   Smokeless tobacco: Never  Vaping Use   Vaping Use: Never used  Substance and Sexual Activity   Alcohol use: Yes    Alcohol/week: 1.0 standard drink of alcohol    Types: 1 Cans of beer per week    Comment: last use 09/08/21 "rarely"   Drug use: Not Currently    Types: Marijuana    Comment: last use age 41   Sexual activity: Not Currently    Partners: Male    Birth control/protection: Condom  Other Topics Concern   Not on file  Social History Narrative   Not on file   Social Determinants of Health   Financial Resource Strain: Not on file  Food Insecurity: Not on file  Transportation Needs: Not on file  Physical Activity: Not on file  Stress: Not  on file  Social Connections: Not on file  Intimate Partner Violence: Not on file     Constitutional: Denies fever, malaise, fatigue, headache or abrupt weight changes.  HEENT: Denies eye pain, eye redness, ear pain, ringing in the ears, wax buildup, runny nose, nasal congestion, bloody nose, or sore throat. Respiratory: Denies difficulty breathing, shortness of breath, cough or sputum production.   Cardiovascular: Denies chest pain, chest tightness, palpitations or swelling in the hands or feet.  Gastrointestinal: Denies abdominal pain, bloating, constipation, diarrhea or blood in the stool.  GU: Denies urgency, frequency, pain with urination, burning sensation, blood in urine, odor or discharge. Musculoskeletal: Patient reports joint pain.  Denies decrease in range of motion, difficulty with gait, muscle pain or joint  swelling.  Skin: Denies redness, rashes, lesions or ulcercations.  Neurological: Denies dizziness, difficulty with memory, difficulty with speech or problems with balance and coordination.  Psych: Patient has a history of anxiety and depression.  Denies SI/HI.  No other specific complaints in a complete review of systems (except as listed in HPI above).     Objective:   Physical Exam  BP 114/80 (BP Location: Left Arm, Patient Position: Sitting, Cuff Size: Normal)   Pulse 85   Temp (!) 96.9 F (36.1 C) (Temporal)   Wt 182 lb (82.6 kg)   SpO2 99%   BMI 30.29 kg/m   Wt Readings from Last 3 Encounters:  11/02/21 196 lb (88.9 kg)  09/15/21 197 lb 3.2 oz (89.4 kg)  07/28/21 197 lb (89.4 kg)    General: Appears her stated age, obese, in NAD. Skin: Warm, dry and intact.  HEENT: Head: normal shape and size; Eyes: sclera white, no icterus, conjunctiva pink, PERRLA and EOMs intact;  Cardiovascular: Normal rate and rhythm. S1,S2 noted.  No murmur, rubs or gallops noted. Pulmonary/Chest: Normal effort and positive vesicular breath sounds. No respiratory distress. No wheezes, rales or ronchi noted.  Musculoskeletal:  No difficulty with gait.  Neurological: Alert and oriented.  Psychiatric: Mood and affect normal. Behavior is normal. Judgment and thought content normal.    BMET    Component Value Date/Time   NA 139 11/02/2021 1034   NA 136 12/22/2012 1242   K 4.2 11/02/2021 1034   K 3.7 12/22/2012 1242   CL 105 11/02/2021 1034   CL 107 12/22/2012 1242   CO2 24 11/02/2021 1034   CO2 23 12/22/2012 1242   GLUCOSE 102 (H) 11/02/2021 1034   GLUCOSE 89 12/22/2012 1242   BUN 12 11/02/2021 1034   BUN 10 12/22/2012 1242   CREATININE 0.84 11/02/2021 1034   CALCIUM 9.5 11/02/2021 1034   CALCIUM 9.7 12/22/2012 1242   GFRNONAA >60 01/25/2021 0809   GFRNONAA >60 12/22/2012 1242   GFRAA >60 12/22/2012 1242    Lipid Panel     Component Value Date/Time   CHOL 176 11/02/2021 1034   TRIG  143 11/02/2021 1034   HDL 53 11/02/2021 1034   CHOLHDL 3.3 11/02/2021 1034   LDLCALC 99 11/02/2021 1034    CBC    Component Value Date/Time   WBC 7.1 11/02/2021 1034   RBC 4.97 11/02/2021 1034   HGB 10.8 (L) 11/02/2021 1034   HGB 11.4 (L) 07/04/2013 1120   HCT 34.6 (L) 11/02/2021 1034   HCT 32.1 (L) 07/08/2013 0616   PLT 299 11/02/2021 1034   PLT 264 07/04/2013 1120   MCV 69.6 (L) 11/02/2021 1034   MCV 74 (L) 07/04/2013 1120   MCH 21.7 (L) 11/02/2021 1034  MCHC 31.2 (L) 11/02/2021 1034   RDW 18.3 (H) 11/02/2021 1034   RDW 15.5 (H) 07/04/2013 1120   LYMPHSABS 1.0 07/04/2013 1120   MONOABS 0.6 07/04/2013 1120   EOSABS 0.0 07/04/2013 1120   BASOSABS 0.0 07/04/2013 1120    Hgb A1C Lab Results  Component Value Date   HGBA1C 6.4 (H) 11/02/2021            Assessment & Plan:     RTC in 3 months for annual exam Nicki Reaper, NP

## 2022-02-14 NOTE — Assessment & Plan Note (Signed)
Encouraged her to consume a low-fat diet Continue atorvastatin

## 2022-02-14 NOTE — Assessment & Plan Note (Signed)
Encouraged diet and exercise for weight loss ?

## 2022-02-14 NOTE — Assessment & Plan Note (Signed)
POCT A1c 6.3% Encourage low-carb diet and exercise for weight loss

## 2022-02-14 NOTE — Assessment & Plan Note (Signed)
Encouraged her to avoid foods that trigger reflux Encouraged weight loss as this can help reduce reflux symptoms Continue famotidine

## 2022-02-14 NOTE — Assessment & Plan Note (Signed)
Continue Abilify She will continue to meet with her psychiatrist and therapist Support offered

## 2022-03-31 ENCOUNTER — Other Ambulatory Visit: Payer: Self-pay | Admitting: Internal Medicine

## 2022-03-31 MED ORDER — ATORVASTATIN CALCIUM 20 MG PO TABS: 20.0000 mg | ORAL_TABLET | Freq: Every day | ORAL | 0 refills | Status: DC

## 2022-04-14 IMAGING — DX DG WRIST COMPLETE 3+V*R*
4 series · 4 of 4 positions shown · non-contrast
Comparison: None.

CLINICAL DATA: Right wrist pain for 2 days, no known injury,
initial encounter

EXAM:
RIGHT WRIST - COMPLETE 3+ VIEW

[wrist ap (1 of 2)]
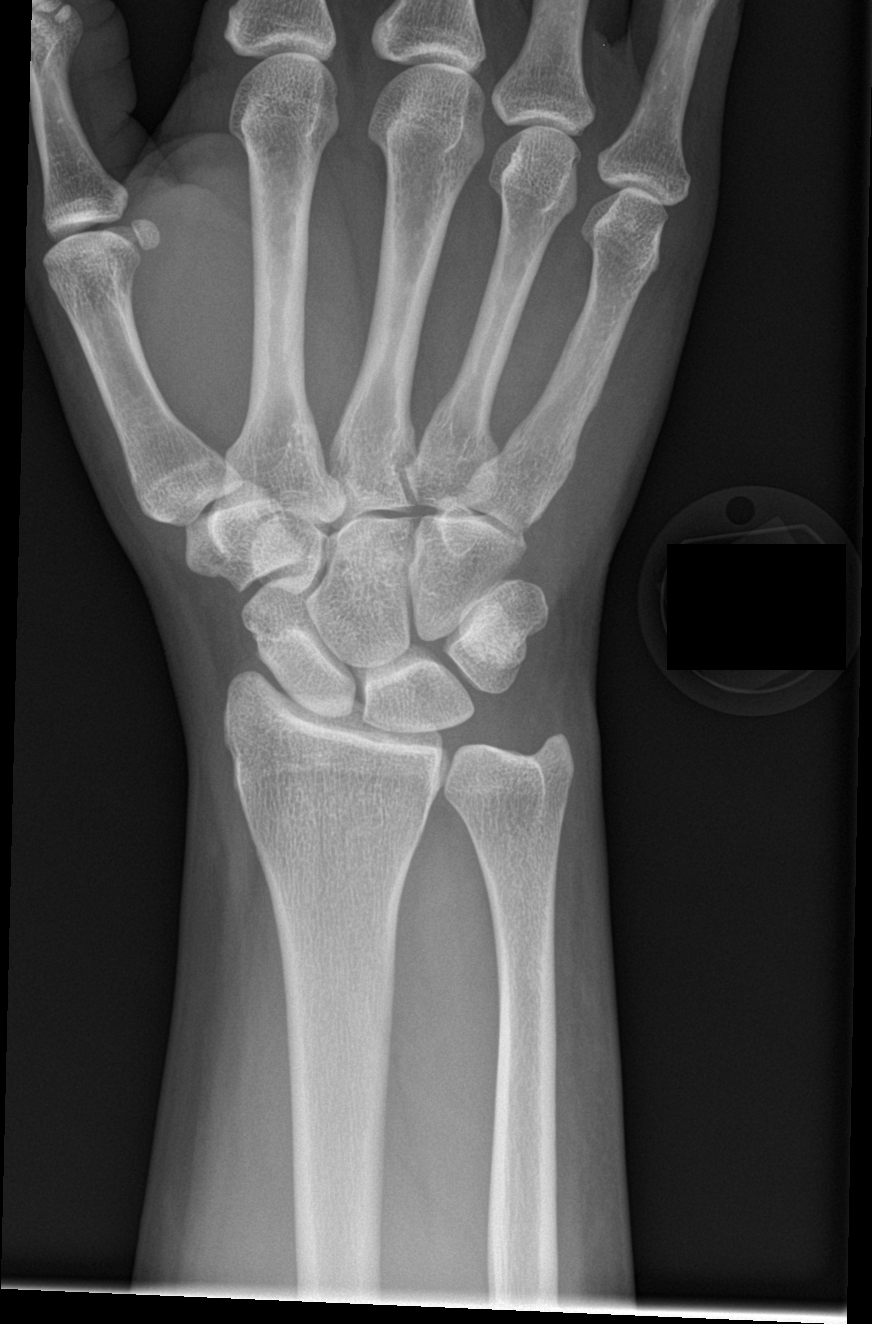

[wrist obl]
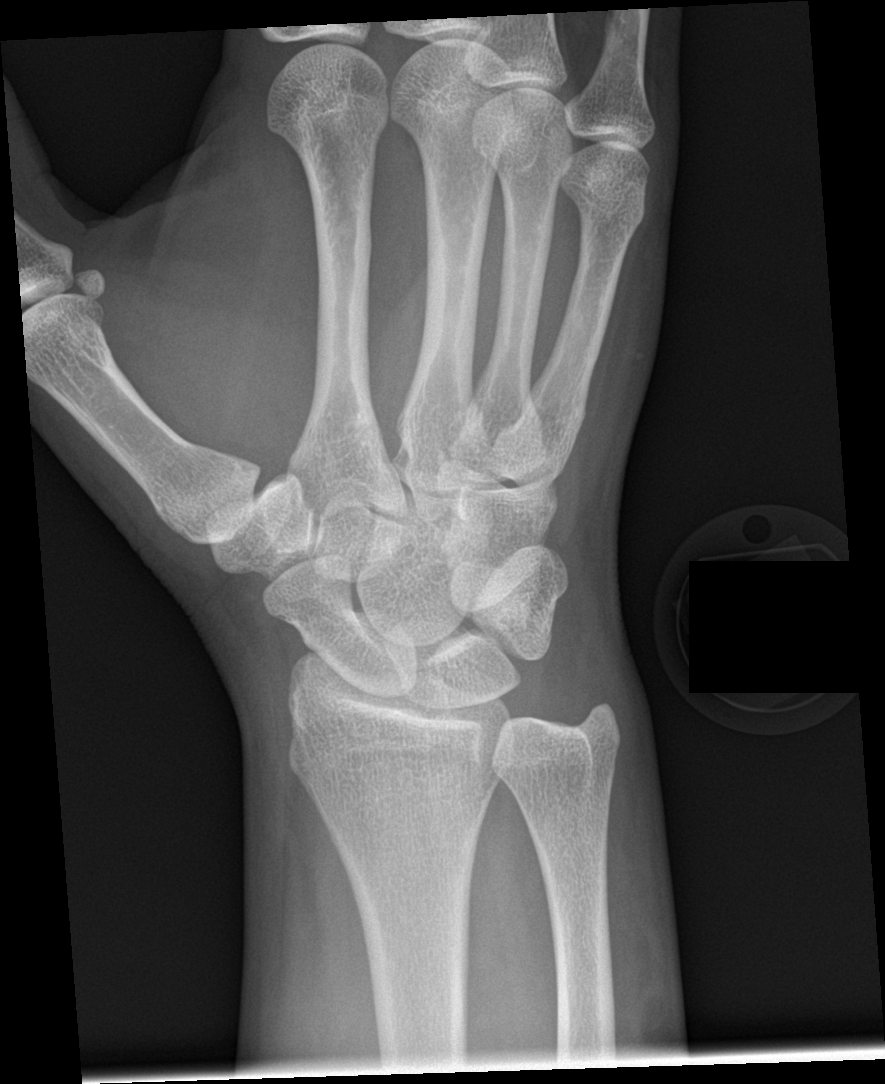

[wrist lat]
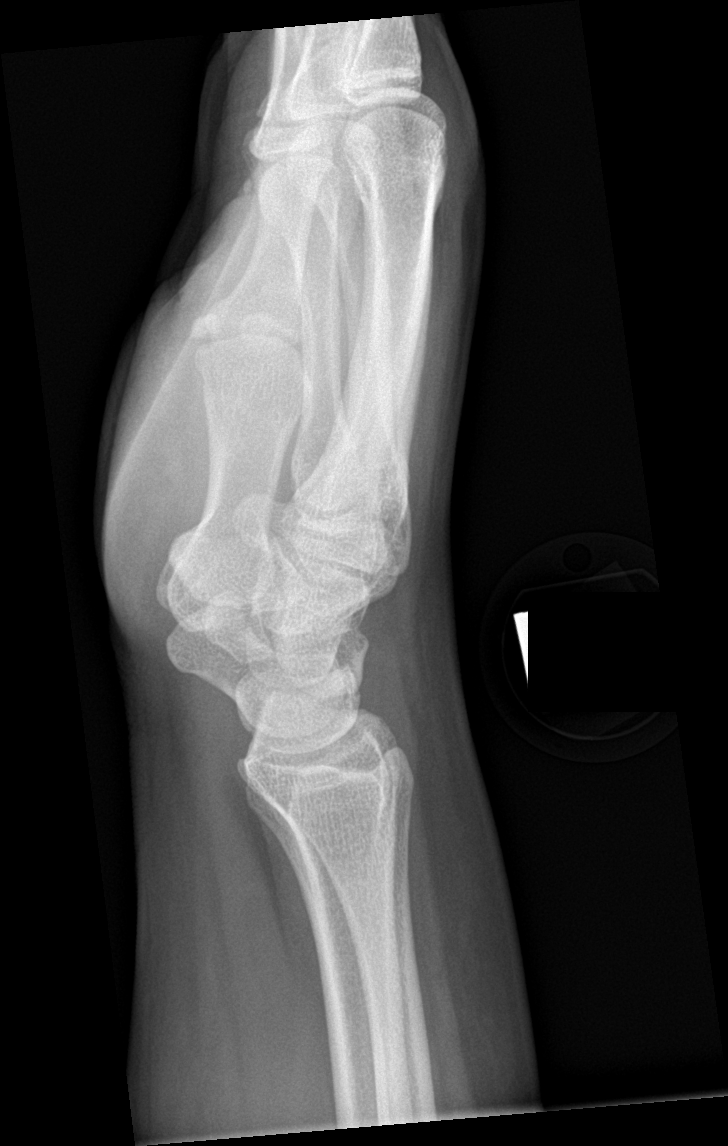

[wrist ap (2 of 2)]
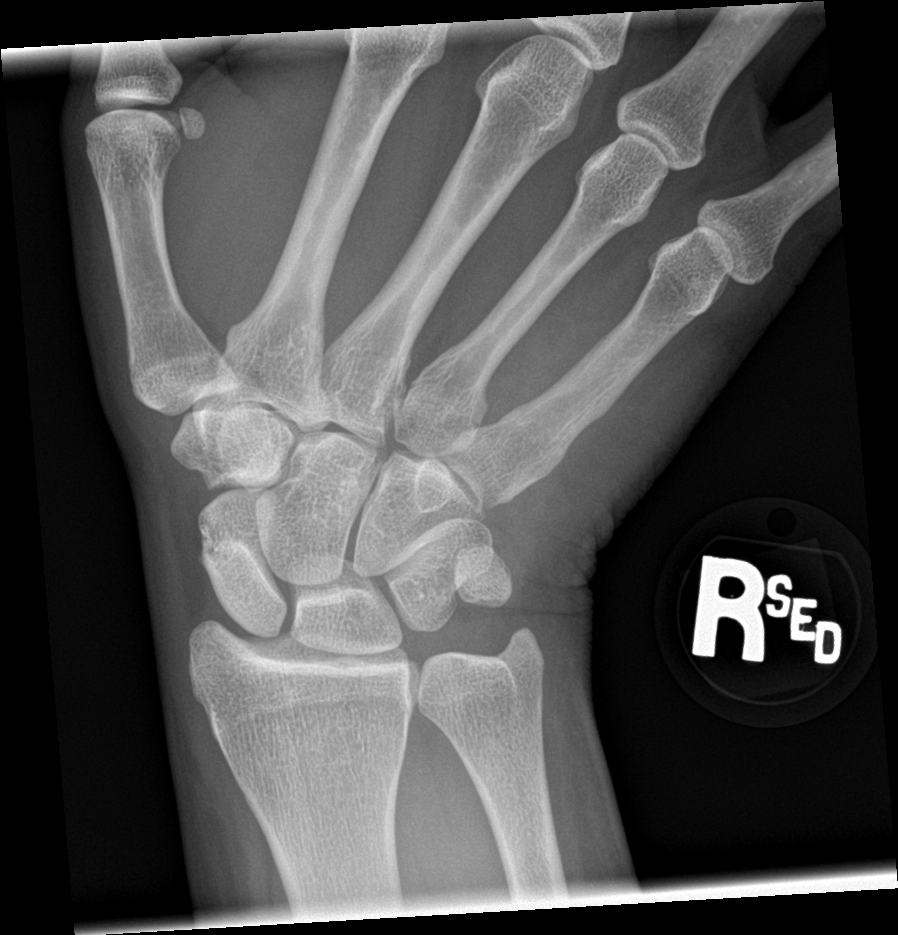

[4 of 4 positions shown; findings below may reference images not displayed]

FINDINGS: There is no evidence of fracture or dislocation. There is no
evidence of arthropathy or other focal bone abnormality. Soft
tissues are unremarkable.
IMPRESSION: No acute abnormality noted.

## 2022-05-20 ENCOUNTER — Other Ambulatory Visit: Payer: Self-pay | Admitting: Internal Medicine

## 2022-05-23 ENCOUNTER — Other Ambulatory Visit: Payer: Self-pay

## 2022-05-23 ENCOUNTER — Other Ambulatory Visit (HOSPITAL_COMMUNITY): Payer: Self-pay

## 2022-05-23 MED ORDER — ATORVASTATIN CALCIUM 20 MG PO TABS
20.0000 mg | ORAL_TABLET | Freq: Every day | ORAL | 0 refills | Status: DC
  Filled 2022-05-23: qty 90, 90d supply, fill #0

## 2022-08-14 ENCOUNTER — Other Ambulatory Visit: Payer: Self-pay | Admitting: Internal Medicine

## 2022-08-15 ENCOUNTER — Other Ambulatory Visit (HOSPITAL_COMMUNITY): Payer: Self-pay

## 2022-08-15 ENCOUNTER — Other Ambulatory Visit: Payer: Self-pay

## 2022-08-15 MED ORDER — ATORVASTATIN CALCIUM 20 MG PO TABS
20.0000 mg | ORAL_TABLET | Freq: Every day | ORAL | 0 refills | Status: DC
  Filled 2022-08-15: qty 90, 90d supply, fill #0

## 2022-08-15 NOTE — Telephone Encounter (Signed)
Requested Prescriptions  Pending Prescriptions Disp Refills   atorvastatin (LIPITOR) 20 MG tablet 90 tablet 0    Sig: Take 1 tablet (20 mg total) by mouth daily.     Cardiovascular:  Antilipid - Statins Failed - 08/14/2022 12:25 PM      Failed - Lipid Panel in normal range within the last 12 months    Cholesterol  Date Value Ref Range Status  11/02/2021 176 <200 mg/dL Final   LDL Cholesterol (Calc)  Date Value Ref Range Status  11/02/2021 99 mg/dL (calc) Final    Comment:    Reference range: <100 . Desirable range <100 mg/dL for primary prevention;   <70 mg/dL for patients with CHD or diabetic patients  with > or = 2 CHD risk factors. Marland Kitchen LDL-C is now calculated using the Martin-Hopkins  calculation, which is a validated novel method providing  better accuracy than the Friedewald equation in the  estimation of LDL-C.  Cresenciano Genre et al. Annamaria Helling. MU:[id number]): 2061-2068  (http://education.[facility].com/faq/FAQ164)    HDL  Date Value Ref Range Status  11/02/2021 53 > OR = 50 mg/dL Final   Triglycerides  Date Value Ref Range Status  11/02/2021 143 <150 mg/dL Final         Passed - Patient is not pregnant      Passed - Valid encounter within last 12 months    Recent Outpatient Visits           6 months ago Prediabetes   [facility] [place], Coralie Keens, NP   9 months ago Eczema, unspecified type   [facility] [place], Coralie Keens, NP   11 months ago Viral sinusitis   [facility] [place], Coralie Keens, NP   1 year ago [facility] [place], [place] W, NP   1 year ago Severe episode of recurrent major depressive disorder, with psychotic features [facility])   [facility] [place], Coralie Keens, [place]

## 2022-10-17 ENCOUNTER — Other Ambulatory Visit: Payer: Self-pay | Admitting: Internal Medicine

## 2022-10-18 ENCOUNTER — Other Ambulatory Visit (HOSPITAL_COMMUNITY): Payer: Self-pay

## 2022-10-18 MED ORDER — ATORVASTATIN CALCIUM 20 MG PO TABS
20.0000 mg | ORAL_TABLET | Freq: Every day | ORAL | 0 refills | Status: DC
  Filled 2022-10-18 – 2022-10-20 (×2): qty 90, 90d supply, fill #0

## 2022-10-18 NOTE — Telephone Encounter (Signed)
Requested Prescriptions  Pending Prescriptions Disp Refills   atorvastatin (LIPITOR) 20 MG tablet 90 tablet 0    Sig: Take 1 tablet (20 mg total) by mouth daily.     Cardiovascular:  Antilipid - Statins Failed - 10/17/2022  5:05 AM      Failed - Lipid Panel in normal range within the last 12 months    Cholesterol  Date Value Ref Range Status  11/02/2021 176 <200 mg/dL Final   LDL Cholesterol (Calc)  Date Value Ref Range Status  11/02/2021 99 mg/dL (calc) Final    Comment:    Reference range: <100 . Desirable range <100 mg/dL for primary prevention;   <70 mg/dL for patients with CHD or diabetic patients  with > or = 2 CHD risk factors. Marland Kitchen LDL-C is now calculated using the Martin-Hopkins  calculation, which is a validated novel method providing  better accuracy than the Friedewald equation in the  estimation of LDL-C.  Horald Pollen et al. Lenox Ahr. 4098;119(14): 2061-2068  (http://education.[facility].com/faq/FAQ164)    HDL  Date Value Ref Range Status  11/02/2021 53 > OR = 50 mg/dL Final   Triglycerides  Date Value Ref Range Status  11/02/2021 143 <150 mg/dL Final         Passed - Patient is not pregnant      Passed - Valid encounter within last 12 months    Recent Outpatient Visits           8 months ago Prediabetes   [facility] [place], Salvadore Oxford, NP   11 months ago Eczema, unspecified type   [place] [facility] [place], Salvadore Oxford, NP   1 year ago Viral sinusitis   [place] [facility] [place], Salvadore Oxford, NP   1 year ago Prediabetes   [place] [facility] [place], [place] W, NP   1 year ago Severe episode of recurrent major depressive disorder, with psychotic features [facility])    [facility] [place], Salvadore Oxford, [place]

## 2022-10-20 ENCOUNTER — Other Ambulatory Visit (HOSPITAL_COMMUNITY): Payer: Self-pay

## 2022-10-21 ENCOUNTER — Other Ambulatory Visit (HOSPITAL_COMMUNITY): Payer: Self-pay

## 2022-12-17 IMAGING — MR MR PELVIS W/O CM
4 of 5 series · 22 of 48 positions shown · non-contrast
Comparison: None.

CLINICAL DATA: Right groin pain for the past week.  No injury.

EXAM:
MRI PELVIS WITHOUT CONTRAST
TECHNIQUE: Multiplanar multisequence MR imaging of the pelvis was performed. No
intravenous contrast was administered.

[Series 8: T1 · axial · 4.0mm · 0.74mm/px · z∈[-116,+109]mm · 8 of 46 slices shown (1 of 2)]
[im 1/46]
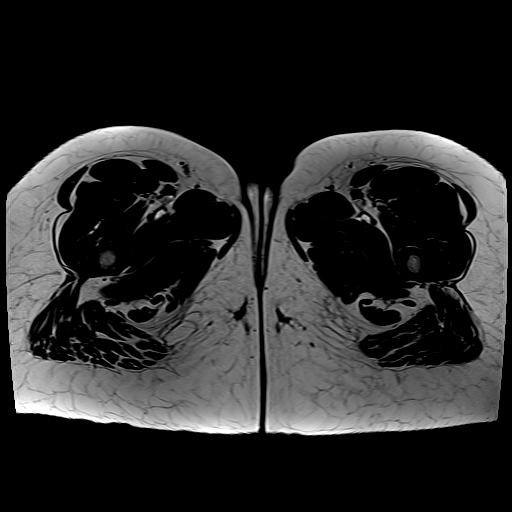
[im 6/46]
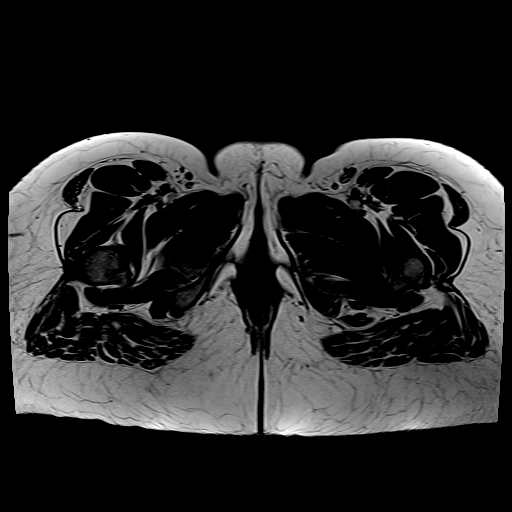
[im 16/46]
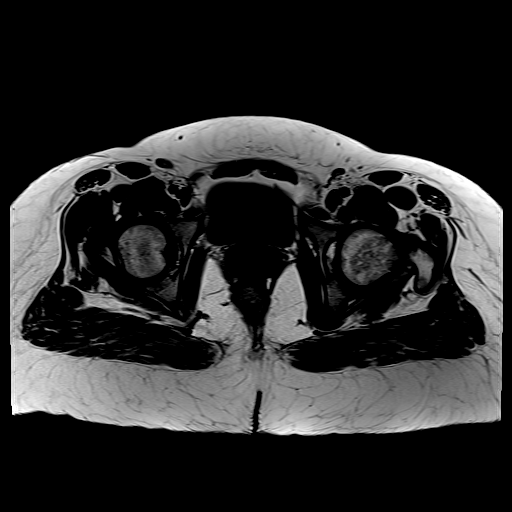
[im 21/46]
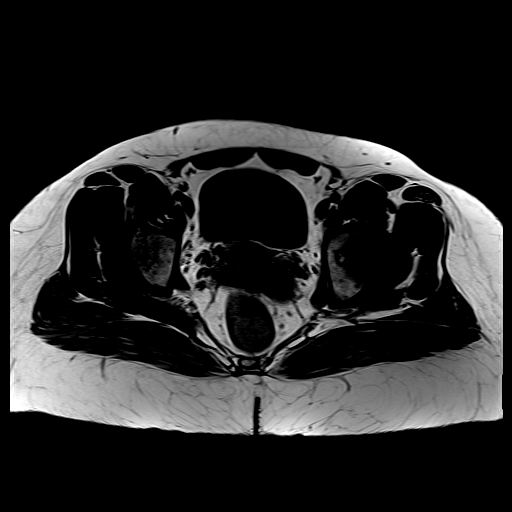
[im 26/46]
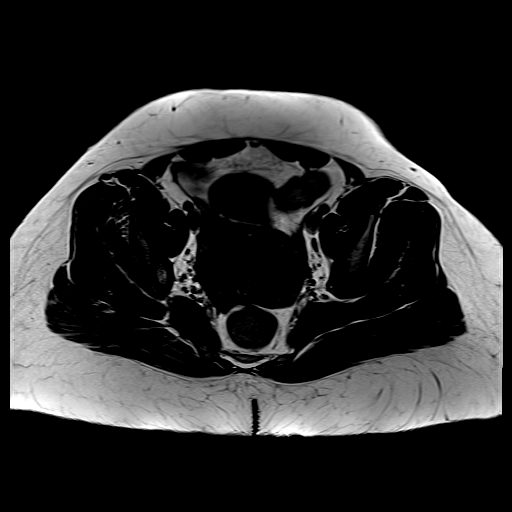
[im 31/46]
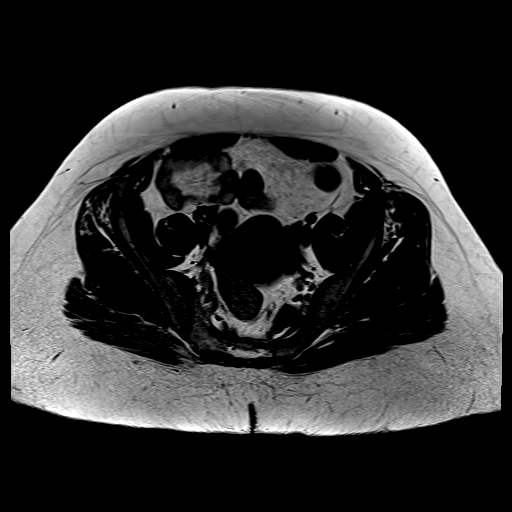
[im 41/46]
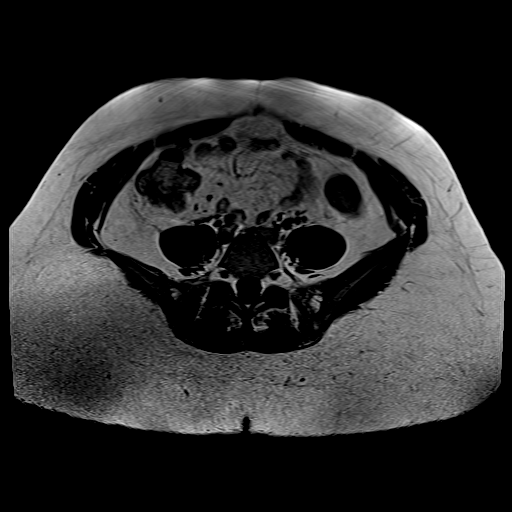
[im 46/46]
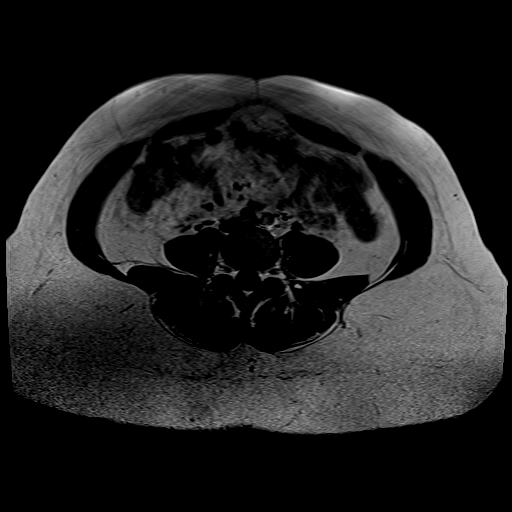

[Series 9: T2 fat-sat · axial · 4.0mm · 0.74mm/px · z∈[-116,+79]mm · 8 of 46 slices shown]
[im 1/46]
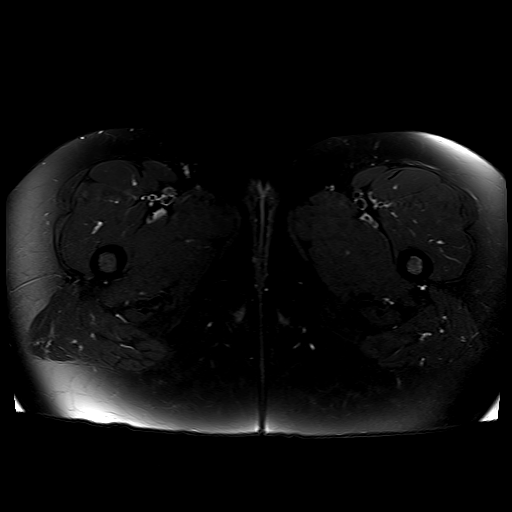
[im 6/46]
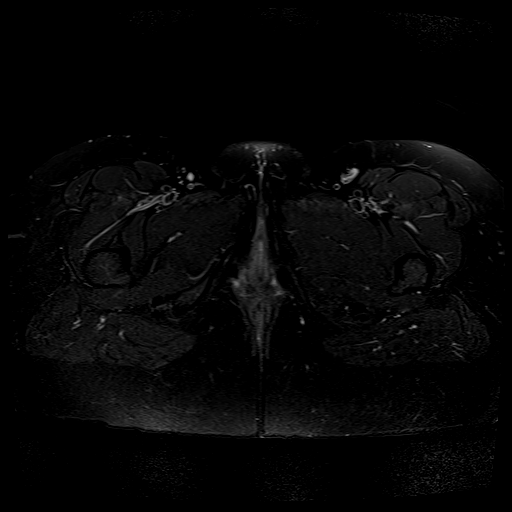
[im 12/46]
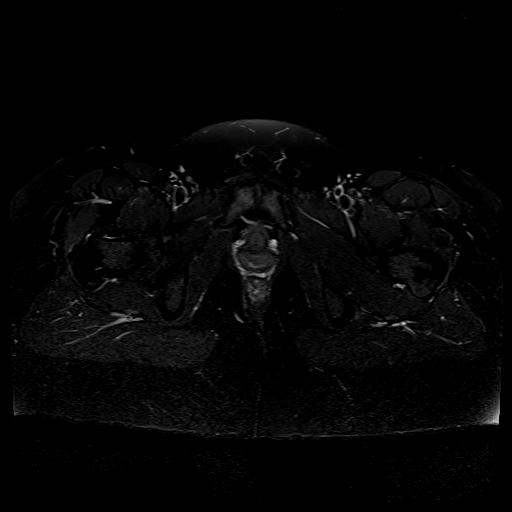
[im 17/46]
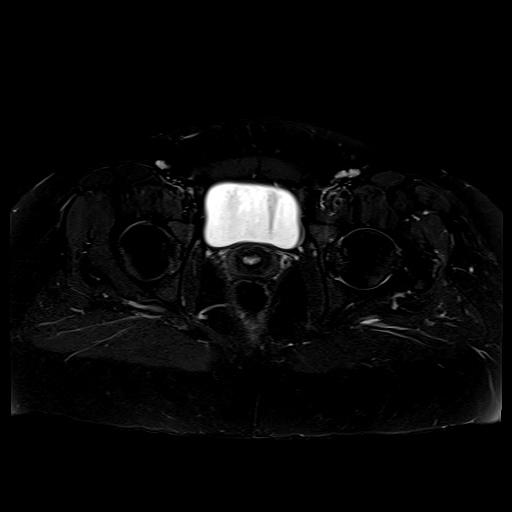
[im 23/46]
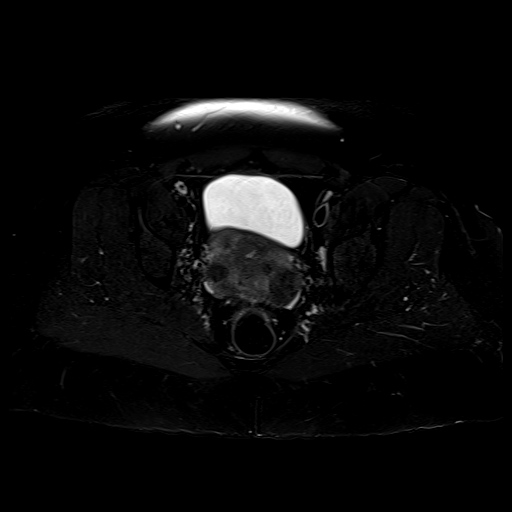
[im 29/46]
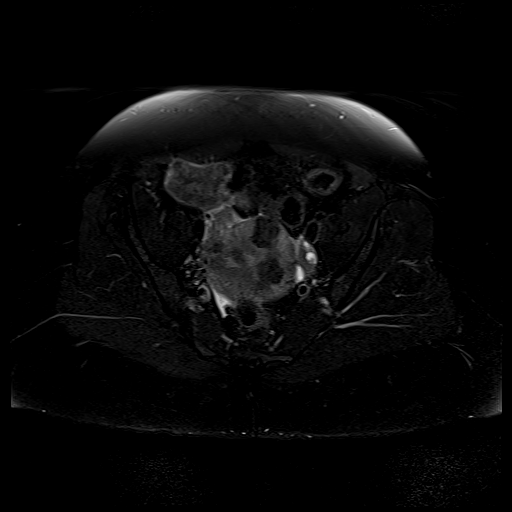
[im 34/46]
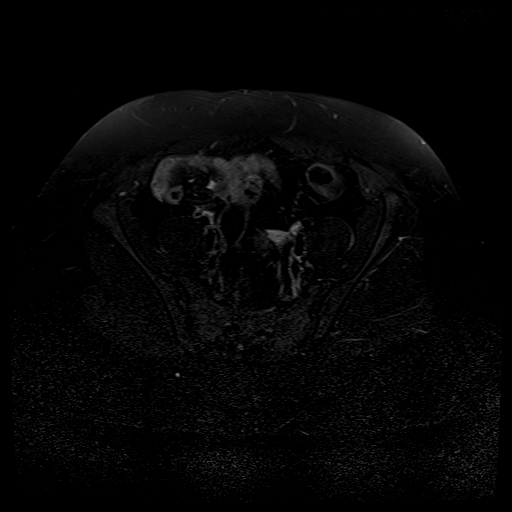
[im 40/46]
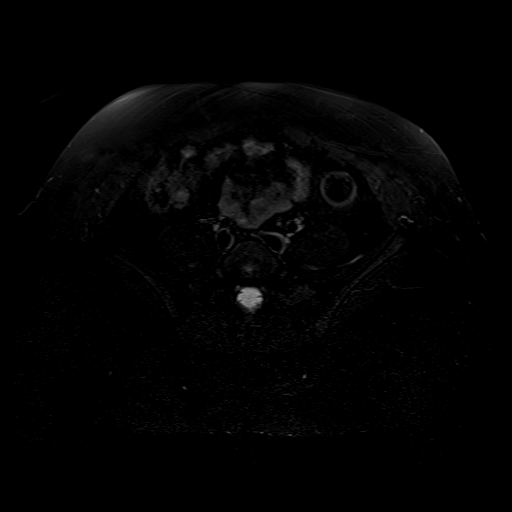

[Series 10: STIR · coronal · 4.0mm · 1.25mm/px · 3 of 36 slices shown]
[im 6/36]
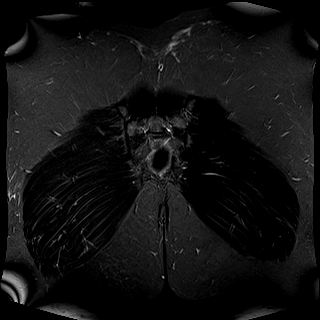
[im 18/36]
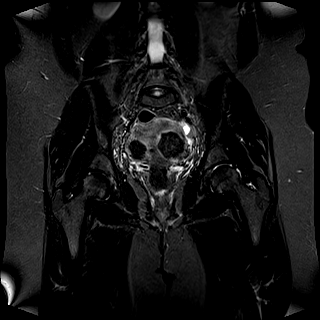
[im 30/36]
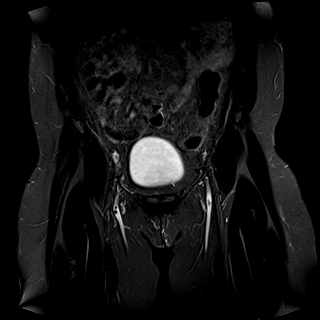

[Series 11: T1 · coronal · 4.0mm · 1.25mm/px · 3 of 36 slices shown (2 of 2)]
[im 6/36]
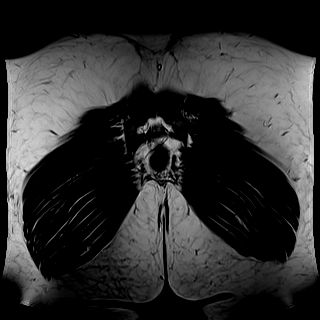
[im 18/36]
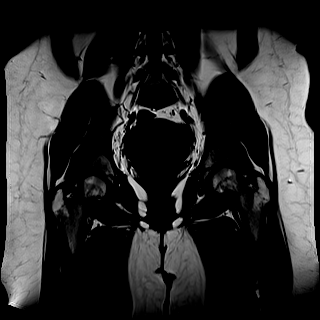
[im 30/36]
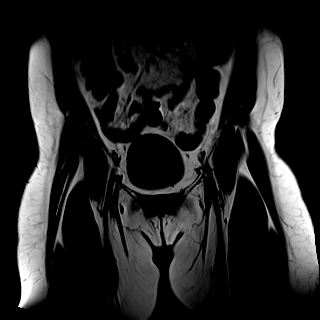

[22 of 48 positions shown; findings below may reference images not displayed]

FINDINGS: Bones: There is no evidence of acute fracture, dislocation or
avascular necrosis. No focal bone lesion. The visualized sacroiliac
joints and symphysis pubis appear normal.

Articular cartilage and labrum

Articular cartilage: Partial-thickness cartilage loss in the right
anterior superior hip joint with prominent subchondral cystic change
in the anterior superior acetabulum.

Labrum: Right anterior superior labral tear. No paralabral
abnormality.

Joint or bursal effusion

Joint effusion: No significant hip joint effusion.

Bursae: No focal periarticular fluid collection.

Muscles and tendons

Muscles and tendons: The visualized gluteus, hamstring and iliopsoas
tendons appear normal. No muscle edema or atrophy.

Other findings

Miscellaneous: Multiple uterine fibroids measuring up to 3.8 cm.
Trace free fluid in the pelvis, likely physiologic.
IMPRESSION: 1. No acute abnormality.  Mild right hip osteoarthritis.
2. Right anterior superior labral tear.
3. Fibroid uterus.

## 2023-01-18 ENCOUNTER — Ambulatory Visit (INDEPENDENT_AMBULATORY_CARE_PROVIDER_SITE_OTHER): Payer: MEDICAID | Admitting: Internal Medicine

## 2023-01-18 VITALS — BP 116/74 | HR 77 | Temp 96.6°F | Ht 65.0 in | Wt 195.0 lb

## 2023-01-18 DIAGNOSIS — R7303 Prediabetes: Secondary | ICD-10-CM | POA: Diagnosis not present

## 2023-01-18 DIAGNOSIS — Z1231 Encounter for screening mammogram for malignant neoplasm of breast: Secondary | ICD-10-CM

## 2023-01-18 DIAGNOSIS — Z0001 Encounter for general adult medical examination with abnormal findings: Secondary | ICD-10-CM | POA: Diagnosis not present

## 2023-01-18 DIAGNOSIS — E782 Mixed hyperlipidemia: Secondary | ICD-10-CM | POA: Diagnosis not present

## 2023-01-18 DIAGNOSIS — E6609 Other obesity due to excess calories: Secondary | ICD-10-CM

## 2023-01-18 DIAGNOSIS — Z6832 Body mass index (BMI) 32.0-32.9, adult: Secondary | ICD-10-CM

## 2023-01-18 NOTE — Patient Instructions (Signed)

## 2023-01-18 NOTE — Progress Notes (Signed)
Subjective:    Patient ID: Meagan Medina, female    DOB: 1980/08/22, 42 y.o.   MRN: [id number]  HPI  Patient presents to clinic today for her annual exam.  Flu: Never Tetanus: 05/2020 COVID: Never Pap smear: 2023[facility] Mammogram: Never Vision screening: annually Dentist: as needed  Diet: She does eat meat. She consumes fruits and veggies. She does eat some fried foods. She drinks mostly water, dt. soda Exercise: walking  Review of Systems     Past Medical History:  Diagnosis Date   Abnormal Pap smear of cervix    Depression    GERD (gastroesophageal reflux disease)     Current Outpatient Medications  Medication Sig Dispense Refill   ARIPiprazole (ABILIFY) 5 MG tablet Take 1 tablet  by mouth daily. 30 tablet 0   atorvastatin (LIPITOR) 20 MG tablet Take 1 tablet (20 mg total) by mouth daily. 90 tablet 0   famotidine (PEPCID) 10 MG tablet Take 1 tablet (10 mg total) by mouth 2 (two) times daily. 180 tablet 1   No current facility-administered medications for this visit.    Allergies  Allergen Reactions   Ibuprofen Nausea And Vomiting and Other (See Comments)    Family History  Problem Relation Age of Onset   Breast cancer Neg Hx     Social History   Socioeconomic History   Marital status: Single    Spouse name: Not on file   Number of children: Not on file   Years of education: Not on file   Highest education level: 9th grade  Occupational History   Not on file  Tobacco Use   Smoking status: Former    Current packs/day: 0.00    Types: Cigarettes    Start date: 2002    Quit date: 2003    Years since quitting: 21.6   Smokeless tobacco: Never  Vaping Use   Vaping status: Never Used  Substance and Sexual Activity   Alcohol use: Yes    Alcohol/week: 1.0 standard drink of alcohol    Types: 1 Cans of beer per week    Comment: last use 09/08/21 "rarely"   Drug use: Not Currently    Types: Marijuana    Comment: last use age  35   Sexual activity: Not Currently    Partners: Male    Birth control/protection: Condom  Other Topics Concern   Not on file  Social History Narrative   Not on file   Social Determinants of Health   Financial Resource Strain: Low Risk  (01/17/2023)   Overall Financial Resource Strain (CARDIA)    Difficulty of Paying Living Expenses: Not hard at all  Food Insecurity: No Food Insecurity (01/17/2023)   Hunger Vital Sign    Worried About Running Out of Food in the Last Year: Never true    Ran Out of Food in the Last Year: Never true  Transportation Needs: No Transportation Needs (01/17/2023)   PRAPARE - Administrator, Civil Service (Medical): No    Lack of Transportation (Non-Medical): No  Physical Activity: Insufficiently Active (01/17/2023)   Exercise Vital Sign    Days of Exercise per Week: 1 day    Minutes of Exercise per Session: 30 min  Stress: Stress Concern Present (01/17/2023)   [employer] of Occupational Health - Occupational Stress Questionnaire    Feeling of Stress : To some extent  Social Connections: Socially Isolated (01/17/2023)   Social Connection and Isolation Panel [NHANES]  Frequency of Communication with Friends and Family: Three times a week    Frequency of Social Gatherings with Friends and Family: Once a week    Attends Religious Services: Never    Database administrator or Organizations: No    Attends Engineer, structural: Not on file    Marital Status: Never married  Intimate Partner Violence: Not on file     Constitutional: Denies fever, malaise, fatigue, headache or abrupt weight changes.  HEENT: Denies eye pain, eye redness, ear pain, ringing in the ears, wax buildup, runny nose, nasal congestion, bloody nose, or sore throat. Respiratory: Denies difficulty breathing, shortness of breath, cough or sputum production.   Cardiovascular: Denies chest pain, chest tightness, palpitations or swelling in the hands or feet.   Gastrointestinal: Denies abdominal pain, bloating, constipation, diarrhea or blood in the stool.  GU: Denies urgency, frequency, pain with urination, burning sensation, blood in urine, odor or discharge. Musculoskeletal: Patient reports joint pain, mainly in her right hip.  Denies decrease in range of motion, difficulty with gait, muscle pain or joint swelling.  Skin: Denies redness, rashes, lesions or ulcercations.  Neurological: Denies dizziness, difficulty with memory, difficulty with speech or problems with balance and coordination.  Psych: Denies anxiety, depression, SI/HI.  No other specific complaints in a complete review of systems (except as listed in HPI above).  Objective:   Physical Exam   BP 116/74 (BP Location: Left Arm, Patient Position: Sitting, Cuff Size: Normal)   Pulse 77   Temp (!) 96.6 F (35.9 C) (Temporal)   Ht 5\' 5"  (1.651 m)   Wt 195 lb (88.5 kg)   SpO2 97%   BMI 32.45 kg/m   Wt Readings from Last 3 Encounters:  02/14/22 182 lb (82.6 kg)  11/02/21 196 lb (88.9 kg)  09/15/21 197 lb 3.2 oz (89.4 kg)    General: Appears her stated age, obese, in NAD. Skin: Warm, dry and intact.  HEENT: Head: normal shape and size; Eyes: sclera white, no icterus, conjunctiva pink, PERRLA and EOMs intact;  Neck:  Neck supple, trachea midline. No masses, lumps or thyromegaly present.  Cardiovascular: Normal rate and rhythm. S1,S2 noted.  No murmur, rubs or gallops noted. No JVD or BLE edema.  Pulmonary/Chest: Normal effort and positive vesicular breath sounds. No respiratory distress. No wheezes, rales or ronchi noted.  Abdomen: Normal bowel sounds. Musculoskeletal: Decreased abduction, adduction, flexion and extension of the right hip secondary to pain.  Decreased internal and external rotation of the right hip secondary to pain.  Strength 5/5 BUE.  Strength 3 out of 5 BLE. Walking with rolling walker. Neurological: Alert and oriented. Cranial nerves II-XII grossly intact.  Coordination normal.  Psychiatric: Mood and affect normal. Behavior is normal. Judgment and thought content normal.    BMET    Component Value Date/Time   NA 139 11/02/2021 1034   NA 136 12/22/2012 1242   K 4.2 11/02/2021 1034   K 3.7 12/22/2012 1242   CL 105 11/02/2021 1034   CL 107 12/22/2012 1242   CO2 24 11/02/2021 1034   CO2 23 12/22/2012 1242   GLUCOSE 102 (H) 11/02/2021 1034   GLUCOSE 89 12/22/2012 1242   BUN 12 11/02/2021 1034   BUN 10 12/22/2012 1242   CREATININE 0.84 11/02/2021 1034   CALCIUM 9.5 11/02/2021 1034   CALCIUM 9.7 12/22/2012 1242   GFRNONAA >60 01/25/2021 0809   GFRNONAA >60 12/22/2012 1242   GFRAA >60 12/22/2012 1242    Lipid Panel  Component Value Date/Time   CHOL 176 11/02/2021 1034   TRIG 143 11/02/2021 1034   HDL 53 11/02/2021 1034   CHOLHDL 3.3 11/02/2021 1034   LDLCALC 99 11/02/2021 1034    CBC    Component Value Date/Time   WBC 7.1 11/02/2021 1034   RBC 4.97 11/02/2021 1034   HGB 10.8 (L) 11/02/2021 1034   HGB 11.4 (L) 07/04/2013 1120   HCT 34.6 (L) 11/02/2021 1034   HCT 32.1 (L) 07/08/2013 0616   PLT 299 11/02/2021 1034   PLT 264 07/04/2013 1120   MCV 69.6 (L) 11/02/2021 1034   MCV 74 (L) 07/04/2013 1120   MCH 21.7 (L) 11/02/2021 1034   MCHC 31.2 (L) 11/02/2021 1034   RDW 18.3 (H) 11/02/2021 1034   RDW 15.5 (H) 07/04/2013 1120   LYMPHSABS 1.0 07/04/2013 1120   MONOABS 0.6 07/04/2013 1120   EOSABS 0.0 07/04/2013 1120   BASOSABS 0.0 07/04/2013 1120    Hgb A1C Lab Results  Component Value Date   HGBA1C 6.3 02/14/2022           Assessment & Plan:   Preventative health maintenance:  Encouraged her to get a flu shot in the fall Tetanus UTD Encouraged her to get her COVID-vaccine Pap smear UTD per her report, will request copy Mammogram ordered-she will call to schedule Encouraged her to consume a balanced diet and exercise regimen Advised her to see an eye doctor and dentist annually Will check CBC, CMET,  lipid, A1c today  RTC in 6 months, follow-up chronic conditions Nicki Reaper, NP

## 2023-01-18 NOTE — Assessment & Plan Note (Signed)
Encouraged diet and exercise for weight loss ?

## 2023-01-19 ENCOUNTER — Other Ambulatory Visit: Payer: Self-pay

## 2023-01-19 ENCOUNTER — Other Ambulatory Visit: Payer: Self-pay | Admitting: Internal Medicine

## 2023-01-19 LAB — CBC
HCT: 33.2 % — ABNORMAL LOW (ref 35.0–45.0)
Hemoglobin: 10 g/dL — ABNORMAL LOW (ref 11.7–15.5)
MCH: 21.9 pg — ABNORMAL LOW (ref 27.0–33.0)
MCHC: 30.1 g/dL — ABNORMAL LOW (ref 32.0–36.0)
MCV: 72.8 fL — ABNORMAL LOW (ref 80.0–100.0)
MPV: 11.5 fL (ref 7.5–12.5)
Platelets: 300 10*3/uL (ref 140–400)
RBC: 4.56 10*6/uL (ref 3.80–5.10)
RDW: 16.3 % — ABNORMAL HIGH (ref 11.0–15.0)
WBC: 4.7 10*3/uL (ref 3.8–10.8)

## 2023-01-19 LAB — COMPLETE METABOLIC PANEL WITH GFR
AG Ratio: 1.4 (calc) (ref 1.0–2.5)
ALT: 8 U/L (ref 6–29)
AST: 11 U/L (ref 10–30)
Albumin: 3.9 g/dL (ref 3.6–5.1)
Alkaline phosphatase (APISO): 53 U/L (ref 31–125)
BUN: 13 mg/dL (ref 7–25)
CO2: 24 mmol/L (ref 20–32)
Calcium: 9.1 mg/dL (ref 8.6–10.2)
Chloride: 106 mmol/L (ref 98–110)
Creat: 0.83 mg/dL (ref 0.50–0.99)
Globulin: 2.8 g/dL (ref 1.9–3.7)
Glucose, Bld: 94 mg/dL (ref 65–99)
Potassium: 4 mmol/L (ref 3.5–5.3)
Sodium: 139 mmol/L (ref 135–146)
Total Bilirubin: 0.3 mg/dL (ref 0.2–1.2)
Total Protein: 6.7 g/dL (ref 6.1–8.1)
eGFR: 89 mL/min/{1.73_m2} (ref >=60)

## 2023-01-19 LAB — LIPID PANEL
Cholesterol: 211 mg/dL — ABNORMAL HIGH (ref <200)
HDL: 60 mg/dL (ref >=50)
LDL Cholesterol (Calc): 129 mg/dL — ABNORMAL HIGH
Non-HDL Cholesterol (Calc): 151 mg/dL — ABNORMAL HIGH (ref <130)
Total CHOL/HDL Ratio: 3.5 (calc) (ref <5.0)
Triglycerides: 117 mg/dL (ref <150)

## 2023-01-19 LAB — HEMOGLOBIN A1C
Hgb A1c MFr Bld: 6.5 %{Hb} — ABNORMAL HIGH (ref <5.7)
Mean Plasma Glucose: 140 mg/dL
eAG (mmol/L): 7.7 mmol/L

## 2023-01-19 MED ORDER — ATORVASTATIN CALCIUM 20 MG PO TABS
20.0000 mg | ORAL_TABLET | Freq: Every day | ORAL | 0 refills | Status: DC
  Filled 2023-01-19: qty 90, 90d supply, fill #0

## 2023-01-19 NOTE — Telephone Encounter (Signed)
Requested Prescriptions  Pending Prescriptions Disp Refills   atorvastatin (LIPITOR) 20 MG tablet 90 tablet 0    Sig: Take 1 tablet (20 mg total) by mouth daily.     Cardiovascular:  Antilipid - Statins Failed - 01/19/2023  9:06 AM      Failed - Lipid Panel in normal range within the last 12 months    Cholesterol  Date Value Ref Range Status  01/18/2023 211 (H) <200 mg/dL Final   LDL Cholesterol (Calc)  Date Value Ref Range Status  01/18/2023 129 (H) mg/dL (calc) Final    Comment:    Reference range: <100 . Desirable range <100 mg/dL for primary prevention;   <70 mg/dL for patients with CHD or diabetic patients  with > or = 2 CHD risk factors. Marland Kitchen LDL-C is now calculated using the Martin-Hopkins  calculation, which is a validated novel method providing  better accuracy than the Friedewald equation in the  estimation of LDL-C.  Horald Pollen et al. Lenox Ahr. 8657;846(96): 2061-2068  (http://education.[facility].com/faq/FAQ164)    HDL  Date Value Ref Range Status  01/18/2023 60 > OR = 50 mg/dL Final   Triglycerides  Date Value Ref Range Status  01/18/2023 117 <150 mg/dL Final         Passed - Patient is not pregnant      Passed - Valid encounter within last 12 months    Recent Outpatient Visits           Yesterday Encounter for general adult medical examination with abnormal findings   [place] [facility] [place], Salvadore Oxford, NP   11 months ago Prediabetes   [place] [facility] [place], Salvadore Oxford, NP   1 year ago Eczema, unspecified type   [facility] [place], Salvadore Oxford, NP   1 year ago Viral sinusitis   [place] [facility] [place], Salvadore Oxford, NP   1 year ago Prediabetes   [place] [facility] [place], Salvadore Oxford, NP       Future Appointments             In 6 months [place], Salvadore Oxford, NP [place] [facility], [facility]

## 2023-02-02 ENCOUNTER — Ambulatory Visit: Payer: MEDICAID | Admitting: Internal Medicine

## 2023-02-12 ENCOUNTER — Ambulatory Visit: Payer: MEDICAID | Admitting: Internal Medicine

## 2023-02-21 ENCOUNTER — Encounter: Payer: Self-pay | Admitting: Internal Medicine

## 2023-02-21 ENCOUNTER — Ambulatory Visit: Payer: MEDICAID | Admitting: Internal Medicine

## 2023-02-21 VITALS — BP 114/82 | HR 75 | Temp 96.9°F | Wt 191.0 lb

## 2023-02-21 DIAGNOSIS — E785 Hyperlipidemia, unspecified: Secondary | ICD-10-CM | POA: Diagnosis not present

## 2023-02-21 DIAGNOSIS — E1169 Type 2 diabetes mellitus with other specified complication: Secondary | ICD-10-CM

## 2023-02-21 DIAGNOSIS — E6609 Other obesity due to excess calories: Secondary | ICD-10-CM | POA: Diagnosis not present

## 2023-02-21 DIAGNOSIS — E66811 Obesity, class 1: Secondary | ICD-10-CM

## 2023-02-21 DIAGNOSIS — E1165 Type 2 diabetes mellitus with hyperglycemia: Secondary | ICD-10-CM | POA: Insufficient documentation

## 2023-02-21 DIAGNOSIS — Z6831 Body mass index (BMI) 31.0-31.9, adult: Secondary | ICD-10-CM

## 2023-02-21 MED ORDER — ATORVASTATIN CALCIUM 40 MG PO TABS: 40.0000 mg | ORAL_TABLET | Freq: Every day | ORAL | 0 refills | Status: DC

## 2023-02-21 NOTE — Progress Notes (Signed)
Subjective:    Patient ID: Meagan Medina, female    DOB: 01-08-1981, 42 y.o.   MRN: [id number]  HPI  Patient presents to the clinic today for follow-up of recent labs.  She had an A1c 12/2022 of 6.5% which indicated new onset diabetes.  She had been prediabetic in the past.  She is not taking any oral diabetic medication at this time.  Her last LDL was 129, triglycerides 117.  She is taking atorvastatin as prescribed.  She does not consume a low-fat diet. Diabetes does run in her family.  Review of Systems     Past Medical History:  Diagnosis Date   Abnormal Pap smear of cervix    Depression    GERD (gastroesophageal reflux disease)     Current Outpatient Medications  Medication Sig Dispense Refill   ARIPiprazole (ABILIFY) 5 MG tablet Take 1 tablet  by mouth daily. 30 tablet 0   atorvastatin (LIPITOR) 20 MG tablet Take 1 tablet (20 mg total) by mouth daily. 90 tablet 0   famotidine (PEPCID) 10 MG tablet Take 1 tablet (10 mg total) by mouth 2 (two) times daily. 180 tablet 1   No current facility-administered medications for this visit.    Allergies  Allergen Reactions   Ibuprofen Nausea And Vomiting and Other (See Comments)    Family History  Problem Relation Age of Onset   Hypertension Mother    Hypertension Father    Kidney disease Father    Healthy Sister    Healthy Sister    Healthy Sister    Healthy Sister    Breast cancer Neg Hx    Colon cancer Neg Hx     Social History   Socioeconomic History   Marital status: Single    Spouse name: Not on file   Number of children: Not on file   Years of education: Not on file   Highest education level: 9th grade  Occupational History   Not on file  Tobacco Use   Smoking status: Former    Current packs/day: 0.00    Types: Cigarettes    Start date: 2002    Quit date: 2003    Years since quitting: 21.7   Smokeless tobacco: Never  Vaping Use   Vaping status: Never Used  Substance and Sexual Activity   Alcohol  use: Yes    Alcohol/week: 1.0 standard drink of alcohol    Types: 1 Cans of beer per week    Comment: last use 09/08/21 "rarely"   Drug use: Not Currently    Types: Marijuana    Comment: last use age 85   Sexual activity: Not Currently    Partners: Male    Birth control/protection: Condom  Other Topics Concern   Not on file  Social History Narrative   Not on file   Social Determinants of Health   Financial Resource Strain: Low Risk  (01/18/2023)   Overall Financial Resource Strain (CARDIA)    Difficulty of Paying Living Expenses: Not hard at all  Food Insecurity: No Food Insecurity (01/18/2023)   Hunger Vital Sign    Worried About Running Out of Food in the Last Year: Never true    Ran Out of Food in the Last Year: Never true  Transportation Needs: No Transportation Needs (01/18/2023)   PRAPARE - Administrator, Civil Service (Medical): No    Lack of Transportation (Non-Medical): No  Physical Activity: Insufficiently Active (01/18/2023)   Exercise Vital Sign  Days of Exercise per Week: 1 day    Minutes of Exercise per Session: 30 min  Stress: Stress Concern Present (01/18/2023)   [employer] of Occupational Health - Occupational Stress Questionnaire    Feeling of Stress : To some extent  Social Connections: Socially Isolated (01/18/2023)   Social Connection and Isolation Panel [NHANES]    Frequency of Communication with Friends and Family: Three times a week    Frequency of Social Gatherings with Friends and Family: Once a week    Attends Religious Services: Never    Database administrator or Organizations: No    Attends Engineer, structural: Not on file    Marital Status: Never married  Intimate Partner Violence: Not on file     Constitutional: Denies fever, malaise, fatigue, headache or abrupt weight changes.  HEENT: Denies eye pain, eye redness, ear pain, ringing in the ears, wax buildup, runny nose, nasal congestion, bloody nose, or sore  throat. Respiratory: Denies difficulty breathing, shortness of breath, cough or sputum production.   Cardiovascular: Denies chest pain, chest tightness, palpitations or swelling in the hands or feet.  Gastrointestinal: Denies abdominal pain, bloating, constipation, diarrhea or blood in the stool.  GU: Denies urgency, frequency, pain with urination, burning sensation, blood in urine, odor or discharge. Musculoskeletal: Patient reports joint pain.  Denies decrease in range of motion, difficulty with gait, muscle pain or joint swelling.  Skin: Denies redness, rashes, lesions or ulcercations.  Neurological: Denies dizziness, difficulty with memory, difficulty with speech or problems with balance and coordination.  Psych: Patient has a history of anxiety and depression.  Denies SI/HI.  No other specific complaints in a complete review of systems (except as listed in HPI above).  Objective:   Physical Exam  BP 114/82 (BP Location: Right Arm, Patient Position: Sitting, Cuff Size: Normal)   Pulse 75   Temp (!) 96.9 F (36.1 C) (Temporal)   Wt 191 lb (86.6 kg)   SpO2 98%   BMI 31.78 kg/m   Wt Readings from Last 3 Encounters:  01/18/23 195 lb (88.5 kg)  02/14/22 182 lb (82.6 kg)  11/02/21 196 lb (88.9 kg)    General: Appears her stated age, obese, in NAD. Skin: Warm, dry and intact. No ulcerations noted. HEENT: Head: normal shape and size; Eyes: sclera white, no icterus, conjunctiva pink, PERRLA and EOMs intact;  Cardiovascular: Normal rate and rhythm.  Pulmonary/Chest: Normal effort and positive vesicular breath sounds. No respiratory distress. No wheezes, rales or ronchi noted.  Neurological: Alert and oriented.    BMET    Component Value Date/Time   NA 139 01/18/2023 0939   NA 136 12/22/2012 1242   K 4.0 01/18/2023 0939   K 3.7 12/22/2012 1242   CL 106 01/18/2023 0939   CL 107 12/22/2012 1242   CO2 24 01/18/2023 0939   CO2 23 12/22/2012 1242   GLUCOSE 94 01/18/2023 0939    GLUCOSE 89 12/22/2012 1242   BUN 13 01/18/2023 0939   BUN 10 12/22/2012 1242   CREATININE 0.83 01/18/2023 0939   CALCIUM 9.1 01/18/2023 0939   CALCIUM 9.7 12/22/2012 1242   GFRNONAA >60 01/25/2021 0809   GFRNONAA >60 12/22/2012 1242   GFRAA >60 12/22/2012 1242    Lipid Panel     Component Value Date/Time   CHOL 211 (H) 01/18/2023 0939   TRIG 117 01/18/2023 0939   HDL 60 01/18/2023 0939   CHOLHDL 3.5 01/18/2023 0939   LDLCALC 129 (H) 01/18/2023 1610  CBC    Component Value Date/Time   WBC 4.7 01/18/2023 0939   RBC 4.56 01/18/2023 0939   HGB 10.0 (L) 01/18/2023 0939   HGB 11.4 (L) 07/04/2013 1120   HCT 33.2 (L) 01/18/2023 0939   HCT 32.1 (L) 07/08/2013 0616   PLT 300 01/18/2023 0939   PLT 264 07/04/2013 1120   MCV 72.8 (L) 01/18/2023 0939   MCV 74 (L) 07/04/2013 1120   MCH 21.9 (L) 01/18/2023 0939   MCHC 30.1 (L) 01/18/2023 0939   RDW 16.3 (H) 01/18/2023 0939   RDW 15.5 (H) 07/04/2013 1120   LYMPHSABS 1.0 07/04/2013 1120   MONOABS 0.6 07/04/2013 1120   EOSABS 0.0 07/04/2013 1120   BASOSABS 0.0 07/04/2013 1120    Hgb A1C Lab Results  Component Value Date   HGBA1C 6.5 (H) 01/18/2023            Assessment & Plan:      RTC in 4 months for follow-up of chronic conditions Nicki Reaper, NP

## 2023-02-21 NOTE — Assessment & Plan Note (Signed)
Discussed LDL goal <70 Will increase atorvastatin to 40 mg daily Reinforced DASH diet

## 2023-02-21 NOTE — Assessment & Plan Note (Signed)
Discussed diabetes and standards of medical care Encourage low-carb diet and exercise for weight loss She declines referral to diabetes education and nutrition at this time We will hold off on having her check her blood sugar or starting medication at this time unless A1c does not improve in 3 months Encouraged diabetic eye exam Encouraged routine foot exam She declines immunizations

## 2023-02-21 NOTE — Assessment & Plan Note (Signed)
Encourage diet and exercise for weight loss 

## 2023-03-28 ENCOUNTER — Ambulatory Visit
Admission: RE | Admit: 2023-03-28 | Discharge: 2023-03-28 | Disposition: A | Discharge status: Home / Self Care | Payer: MEDICAID | Source: Ambulatory Visit | Attending: Internal Medicine | Admitting: Internal Medicine

## 2023-03-28 DIAGNOSIS — Z0001 Encounter for general adult medical examination with abnormal findings: Secondary | ICD-10-CM

## 2023-03-28 DIAGNOSIS — Z1231 Encounter for screening mammogram for malignant neoplasm of breast: Secondary | ICD-10-CM | POA: Diagnosis present

## 2023-03-30 ENCOUNTER — Other Ambulatory Visit: Payer: Self-pay | Admitting: Internal Medicine

## 2023-03-30 DIAGNOSIS — R928 Other abnormal and inconclusive findings on diagnostic imaging of breast: Secondary | ICD-10-CM

## 2023-04-02 ENCOUNTER — Other Ambulatory Visit: Payer: Self-pay | Admitting: Internal Medicine

## 2023-04-02 DIAGNOSIS — R928 Other abnormal and inconclusive findings on diagnostic imaging of breast: Secondary | ICD-10-CM

## 2023-04-17 ENCOUNTER — Ambulatory Visit
Admission: RE | Admit: 2023-04-17 | Discharge: 2023-04-17 | Disposition: A | Discharge status: Home / Self Care | Payer: MEDICAID | Source: Ambulatory Visit | Attending: Internal Medicine | Admitting: Internal Medicine

## 2023-04-17 DIAGNOSIS — R928 Other abnormal and inconclusive findings on diagnostic imaging of breast: Secondary | ICD-10-CM | POA: Diagnosis present

## 2023-04-24 ENCOUNTER — Encounter: Payer: Self-pay | Admitting: Internal Medicine

## 2023-04-24 ENCOUNTER — Ambulatory Visit: Payer: MEDICAID | Admitting: Internal Medicine

## 2023-04-24 VITALS — BP 118/80 | Ht 65.0 in | Wt 189.2 lb

## 2023-04-24 DIAGNOSIS — Z6831 Body mass index (BMI) 31.0-31.9, adult: Secondary | ICD-10-CM

## 2023-04-24 DIAGNOSIS — E1169 Type 2 diabetes mellitus with other specified complication: Secondary | ICD-10-CM

## 2023-04-24 DIAGNOSIS — M1651 Unilateral post-traumatic osteoarthritis, right hip: Secondary | ICD-10-CM | POA: Diagnosis not present

## 2023-04-24 DIAGNOSIS — E1165 Type 2 diabetes mellitus with hyperglycemia: Secondary | ICD-10-CM

## 2023-04-24 DIAGNOSIS — E785 Hyperlipidemia, unspecified: Secondary | ICD-10-CM

## 2023-04-24 DIAGNOSIS — E6609 Other obesity due to excess calories: Secondary | ICD-10-CM

## 2023-04-24 DIAGNOSIS — K219 Gastro-esophageal reflux disease without esophagitis: Secondary | ICD-10-CM | POA: Diagnosis not present

## 2023-04-24 DIAGNOSIS — D500 Iron deficiency anemia secondary to blood loss (chronic): Secondary | ICD-10-CM

## 2023-04-24 DIAGNOSIS — E66811 Obesity, class 1: Secondary | ICD-10-CM

## 2023-04-24 DIAGNOSIS — F39 Unspecified mood [affective] disorder: Secondary | ICD-10-CM

## 2023-04-24 LAB — POCT GLYCOSYLATED HEMOGLOBIN (HGB A1C): Hemoglobin A1C: 6.5 % — AB (ref 4.0–5.6)

## 2023-04-24 NOTE — Assessment & Plan Note (Signed)
Encourage diet and exercise for weight loss 

## 2023-04-24 NOTE — Assessment & Plan Note (Signed)
She does not want to take oral iron due to constipation We will monitor CBC

## 2023-04-24 NOTE — Assessment & Plan Note (Signed)
Discussed LDL goal <70 Continue atorvastatin Reinforced low-fat diet

## 2023-04-24 NOTE — Patient Instructions (Signed)

## 2023-04-24 NOTE — Assessment & Plan Note (Signed)
Encourage weight loss as this can help reduce joint pain Continue naproxen as needed

## 2023-04-24 NOTE — Assessment & Plan Note (Signed)
Encouraged her to avoid foods that trigger reflux Encouraged weight loss as this can help reduce reflux symptoms Continue famotidine

## 2023-04-24 NOTE — Progress Notes (Signed)
Subjective:    Patient ID: Meagan Medina, female    DOB: [birth year], 42 y.o.   MRN: [id number]  HPI  Patient presents to clinic today for 70-month follow-up of chronic conditions.  HLD: Her last LDL was 129, triglycerides 117, 12/2022.  She denies myalgias on atorvastatin.  She does not consume a low-fat diet.  DM2: Her last A1c was 6.5%, 12/2022.  She is not taking any oral diabetic medication at this time.  She does not check her sugars.  She checks her feet routinely.  Her last eye exam was.  Flu never.  Pneumovax never.  COVID never.  GERD: Triggered by coffee.  She denies breakthrough on famotidine.  There is no upper GI on file.  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  She takes naproxen as needed with good relief of symptoms.  She does not follow with orthopedics.  Anemia: Her last H/H was 10/33.2, 12/2022.  She is not currently taking any oral iron due to constipation.  She does not follow with hematology.  Anxiety and depression.: Chronic, managed on abilify, but she admits she is not taking this consistently.  She is no longer following with a psychiatrist and therapist at this time.  She denies SI/HI.  Review of Systems   Past Medical History:  Diagnosis Date   Abnormal Pap smear of cervix    Depression    GERD (gastroesophageal reflux disease)     Current Outpatient Medications  Medication Sig Dispense Refill   ARIPiprazole (ABILIFY) 5 MG tablet Take 1 tablet  by mouth daily. 30 tablet 0   atorvastatin (LIPITOR) 40 MG tablet Take 1 tablet (40 mg total) by mouth daily. 90 tablet 0   famotidine (PEPCID) 10 MG tablet Take 1 tablet (10 mg total) by mouth 2 (two) times daily. 180 tablet 1   No current facility-administered medications for this visit.    Allergies  Allergen Reactions   Ibuprofen Nausea And Vomiting and Other (See Comments)    Family History  Problem Relation Age of Onset   Hypertension Mother    Hypertension Father    Kidney disease Father    Healthy  Sister    Healthy Sister    Healthy Sister    Healthy Sister    Breast cancer Neg Hx    Colon cancer Neg Hx     Social History   Socioeconomic History   Marital status: Single    Spouse name: Not on file   Number of children: Not on file   Years of education: Not on file   Highest education level: 9th grade  Occupational History   Not on file  Tobacco Use   Smoking status: Former    Current packs/day: 0.00    Types: Cigarettes    Start date: 2002    Quit date: 2003    Years since quitting: 21.9   Smokeless tobacco: Never  Vaping Use   Vaping status: Never Used  Substance and Sexual Activity   Alcohol use: Yes    Alcohol/week: 1.0 standard drink of alcohol    Types: 1 Cans of beer per week    Comment: last use 09/08/21 "rarely"   Drug use: Not Currently    Types: Marijuana    Comment: last use age 1   Sexual activity: Not Currently    Partners: Male    Birth control/protection: Condom  Other Topics Concern   Not on file  Social History Narrative   Not on file  Social Determinants of Health   Financial Resource Strain: Low Risk  (04/22/2023)   Overall Financial Resource Strain (CARDIA)    Difficulty of Paying Living Expenses: Not hard at all  Food Insecurity: No Food Insecurity (04/22/2023)   Hunger Vital Sign    Worried About Running Out of Food in the Last Year: Never true    Ran Out of Food in the Last Year: Never true  Transportation Needs: Unmet Transportation Needs (04/22/2023)   PRAPARE - Administrator, Civil Service (Medical): No    Lack of Transportation (Non-Medical): Yes  Physical Activity: Insufficiently Active (04/22/2023)   Exercise Vital Sign    Days of Exercise per Week: 2 days    Minutes of Exercise per Session: 30 min  Stress: No Stress Concern Present (04/22/2023)   [employer] of Occupational Health - Occupational Stress Questionnaire    Feeling of Stress : Not at all  Social Connections: Moderately Isolated (04/22/2023)    Social Connection and Isolation Panel [NHANES]    Frequency of Communication with Friends and Family: More than three times a week    Frequency of Social Gatherings with Friends and Family: Three times a week    Attends Religious Services: 1 to 4 times per year    Active Member of Clubs or Organizations: No    Attends Engineer, structural: Not on file    Marital Status: Never married  Intimate Partner Violence: Not on file     Constitutional: Denies fever, malaise, fatigue, headache or abrupt weight changes.  HEENT: Denies eye pain, eye redness, ear pain, ringing in the ears, wax buildup, runny nose, nasal congestion, bloody nose, or sore throat. Respiratory: Denies difficulty breathing, shortness of breath, cough or sputum production.   Cardiovascular: Denies chest pain, chest tightness, palpitations or swelling in the hands or feet.  Gastrointestinal: Denies abdominal pain, bloating, constipation, diarrhea or blood in the stool.  GU: Denies urgency, frequency, pain with urination, burning sensation, blood in urine, odor or discharge. Musculoskeletal: Patient reports joint pain.  Denies decrease in range of motion, difficulty with gait, muscle pain or joint swelling.  Skin: Denies redness, rashes, lesions or ulcercations.  Neurological: Denies dizziness, difficulty with memory, difficulty with speech or problems with balance and coordination.  Psych: Patient has a history of anxiety and depression.  Denies SI/HI.  No other specific complaints in a complete review of systems (except as listed in HPI above).     Objective:   Physical Exam  BP 118/80   Ht 5\' 5"  (1.651 m)   Wt 189 lb 3.2 oz (85.8 kg)   LMP 03/26/2023 (Approximate)   BMI 31.48 kg/m    Wt Readings from Last 3 Encounters:  02/21/23 191 lb (86.6 kg)  01/18/23 195 lb (88.5 kg)  02/14/22 182 lb (82.6 kg)    General: Appears her stated age, obese, in NAD. Skin: Warm, dry and intact.  HEENT: Head: normal  shape and size; Eyes: sclera white, no icterus, conjunctiva pink, PERRLA and EOMs intact;  Cardiovascular: Normal rate and rhythm. S1,S2 noted.  No murmur, rubs or gallops noted. Pulmonary/Chest: Normal effort and positive vesicular breath sounds. No respiratory distress. No wheezes, rales or ronchi noted.  Musculoskeletal:  No difficulty with gait.  Neurological: Alert and oriented.  Psychiatric: Mood and affect normal. Behavior is normal. Judgment and thought content normal.    BMET    Component Value Date/Time   NA 139 01/18/2023 0939   NA 136 12/22/2012 1242  K 4.0 01/18/2023 0939   K 3.7 12/22/2012 1242   CL 106 01/18/2023 0939   CL 107 12/22/2012 1242   CO2 24 01/18/2023 0939   CO2 23 12/22/2012 1242   GLUCOSE 94 01/18/2023 0939   GLUCOSE 89 12/22/2012 1242   BUN 13 01/18/2023 0939   BUN 10 12/22/2012 1242   CREATININE 0.83 01/18/2023 0939   CALCIUM 9.1 01/18/2023 0939   CALCIUM 9.7 12/22/2012 1242   GFRNONAA >60 01/25/2021 0809   GFRNONAA >60 12/22/2012 1242   GFRAA >60 12/22/2012 1242    Lipid Panel     Component Value Date/Time   CHOL 211 (H) 01/18/2023 0939   TRIG 117 01/18/2023 0939   HDL 60 01/18/2023 0939   CHOLHDL 3.5 01/18/2023 0939   LDLCALC 129 (H) 01/18/2023 0939    CBC    Component Value Date/Time   WBC 4.7 01/18/2023 0939   RBC 4.56 01/18/2023 0939   HGB 10.0 (L) 01/18/2023 0939   HGB 11.4 (L) 07/04/2013 1120   HCT 33.2 (L) 01/18/2023 0939   HCT 32.1 (L) 07/08/2013 0616   PLT 300 01/18/2023 0939   PLT 264 07/04/2013 1120   MCV 72.8 (L) 01/18/2023 0939   MCV 74 (L) 07/04/2013 1120   MCH 21.9 (L) 01/18/2023 0939   MCHC 30.1 (L) 01/18/2023 0939   RDW 16.3 (H) 01/18/2023 0939   RDW 15.5 (H) 07/04/2013 1120   LYMPHSABS 1.0 07/04/2013 1120   MONOABS 0.6 07/04/2013 1120   EOSABS 0.0 07/04/2013 1120   BASOSABS 0.0 07/04/2013 1120    Hgb A1C Lab Results  Component Value Date   HGBA1C 6.5 (H) 01/18/2023            Assessment &  Plan:     RTC in 6 months, follow-up chronic conditions Nicki Reaper, NP

## 2023-04-24 NOTE — Assessment & Plan Note (Signed)
POCT A1c 6.5% We will check urine microalbumin at next visit Encourage low-carb diet and exercise for weight loss Not medicated Referral to ophthalmology for diabetic eye exam Encouraged routine foot exam She declines immunizations

## 2023-04-24 NOTE — Assessment & Plan Note (Signed)
Advised her if she is not taking Abilify consistently then she needs to stop this medication She is no longer seeing a psychiatrist and therapist Support offered

## 2023-04-26 ENCOUNTER — Telehealth: Payer: Self-pay

## 2023-04-26 NOTE — Telephone Encounter (Signed)
Copied from CRM [phone] [id number]. Topic: Referral - Status >> Apr 25, 2023  3:10 PM Priscille Loveless wrote: Reason for CRM: Jasmine December from [facility] called and stated that they are out of network with pts ins co. Pt did not want to schedule appt.

## 2023-04-26 NOTE — Telephone Encounter (Signed)
Can we find her an ophthalmologist in network?

## 2023-04-27 NOTE — Telephone Encounter (Signed)
Spoke with [employer]. Gave the number we have on file for patient.

## 2023-05-21 ENCOUNTER — Other Ambulatory Visit: Payer: Self-pay

## 2023-05-21 ENCOUNTER — Other Ambulatory Visit (HOSPITAL_COMMUNITY): Payer: Self-pay

## 2023-05-21 ENCOUNTER — Other Ambulatory Visit: Payer: Self-pay | Admitting: Internal Medicine

## 2023-05-21 MED ORDER — ATORVASTATIN CALCIUM 40 MG PO TABS
40.0000 mg | ORAL_TABLET | Freq: Every day | ORAL | 0 refills | Status: DC
  Filled 2023-05-21: qty 90, 90d supply, fill #0

## 2023-07-06 ENCOUNTER — Ambulatory Visit (LOCAL_COMMUNITY_HEALTH_CENTER): Payer: MEDICAID | Admitting: Family Medicine

## 2023-07-06 ENCOUNTER — Encounter: Payer: Self-pay | Admitting: Family Medicine

## 2023-07-06 VITALS — BP 127/83 | HR 83 | Ht 65.0 in | Wt 187.2 lb

## 2023-07-06 DIAGNOSIS — Z124 Encounter for screening for malignant neoplasm of cervix: Secondary | ICD-10-CM | POA: Insufficient documentation

## 2023-07-06 DIAGNOSIS — Z30017 Encounter for initial prescription of implantable subdermal contraceptive: Secondary | ICD-10-CM | POA: Diagnosis not present

## 2023-07-06 DIAGNOSIS — Z3009 Encounter for other general counseling and advice on contraception: Secondary | ICD-10-CM | POA: Diagnosis not present

## 2023-07-06 MED ORDER — ETONOGESTREL 68 MG ~~LOC~~ IMPL
68.0000 mg | DRUG_IMPLANT | Freq: Once | SUBCUTANEOUS | Status: AC
  Administered 2023-07-06: 68 mg via SUBCUTANEOUS

## 2023-07-06 NOTE — Progress Notes (Signed)
[employer] [facility] DEPARTMENT [facility] [address], [address] [place] [address] Main phone: [phone]  Family Planning Visit - Repeat Yearly Visit  Subjective:  Meagan Medina is a 43 y.o. G1P0  being seen today for an annual wellness visit and to discuss contraception options. The patient is currently using no method - no contraceptive precautions for pregnancy prevention. Patient does not want a pregnancy in the next year.   Patient reports they are looking for a method with the following characteristics:  High efficacy at preventing pregnancy  Patient has the following medical problems:  Patient Active Problem List   Diagnosis Date Noted   Encounter for initial prescription of implantable subdermal contraceptive 07/06/2023   Screening for cervical cancer 07/06/2023   Type 2 diabetes mellitus with hyperglycemia, without long-term current use of insulin (HCC) 02/21/2023   Hyperlipidemia associated with type 2 diabetes mellitus (HCC) 04/26/2021   Mood disorder (HCC) 04/26/2021   Anemia 03/16/2021   Osteoarthritis 03/16/2021   Class 1 obesity due to excess calories with body mass index (BMI) of 31.0 to 31.9 in adult 03/16/2021   Gastroesophageal reflux disease 05/27/2020   Chief Complaint  Patient presents with   Annual Exam    PE/discuss Birth control   HPI Patient reports wanting a physical exam today and nexplanon placement. She accepts a pap smear a this time as well.   Patient denies abnormal discharge, vaginal discharge, dyspareunia.    Review of Systems  Constitutional: Negative.   Genitourinary: Negative.    See flowsheet for other program required questions.   Diabetes screening This patient is 43 y.o. with a BMI of Body mass index is 31.15 kg/m.Marland Kitchen  Is patient eligible for diabetes screening (age >35 and BMI >25)?  not applicable due to known T2DM, already diagnosed Was Hgb A1c ordered? not applicable  STI  screening Patient reports 1 of partners in last year.  Does this patient desire STI screening?  No - politely declines  Hepatitis C screening Has patient been screened once for HCV in the past?  No  No results found for: "HCVAB"  Does the patient meet criteria for HCV testing? No   Hepatitis B screening Does the patient meet criteria for HBV testing? No  Cervical Cancer Screening  No Cervical Cancer Screening results to display.  Health Maintenance Due  Topic Date Due   Pneumococcal Vaccine 8-34 Years old (1 of 2 - PCV) Never done   OPHTHALMOLOGY EXAM  Never done   Diabetic kidney evaluation - Urine ACR  Never done   Cervical Cancer Screening (HPV/Pap Cotest)  Never done   The following portions of the patient's history were reviewed and updated as appropriate: allergies, current medications, past family history, past medical history, past social history, past surgical history and problem list. Problem list updated.  Objective:   Vitals:   07/06/23 0900  BP: 127/83  Pulse: 83  Weight: 187 lb 3.2 oz (84.9 kg)  Height: 5\' 5"  (1.651 m)   Physical Exam Vitals and nursing note reviewed. Exam conducted with a chaperone present Ike Bene, CNA).  Constitutional:      General: She is not in acute distress.    Appearance: Normal appearance. She is normal weight. She is not toxic-appearing.  HENT:     Head: Normocephalic and atraumatic.     Nose: Nose normal. No congestion.     Mouth/Throat:     Mouth: Mucous membranes are moist.     Pharynx: Oropharynx  is clear. No oropharyngeal exudate or posterior oropharyngeal erythema.  Eyes:     General: No scleral icterus.       Right eye: No discharge.        Left eye: No discharge.     Conjunctiva/sclera: Conjunctivae normal.  Pulmonary:     Effort: Pulmonary effort is normal.  Abdominal:     General: Abdomen is flat.     Palpations: There is no mass.     Tenderness: There is no abdominal tenderness. There is no rebound.   Genitourinary:    General: Normal vulva.     Exam position: Lithotomy position.     Pubic Area: No rash or pubic lice.      Tanner stage (genital): 5.     Labia:        Right: No rash or lesion.        Left: No rash or lesion.      Vagina: Normal. No vaginal discharge, erythema, bleeding or lesions.     Cervix: Discharge (clear discharge from cervical os) and erythema present. No cervical motion tenderness, friability or lesion.     Uterus: Normal.      Adnexa: Right adnexa normal and left adnexa normal.     Rectum: Normal.     Comments: pH not obtained at patient asymptomatic.  Musculoskeletal:        General: Normal range of motion.     Cervical back: Neck supple. No rigidity or tenderness.  Lymphadenopathy:     Head:     Right side of head: No preauricular or posterior auricular adenopathy.     Left side of head: No preauricular or posterior auricular adenopathy.     Cervical: No cervical adenopathy.     Upper Body:     Right upper body: No supraclavicular, axillary or epitrochlear adenopathy.     Left upper body: No supraclavicular, axillary or epitrochlear adenopathy.     Lower Body: No right inguinal adenopathy. No left inguinal adenopathy.  Skin:    General: Skin is warm and dry.     Capillary Refill: Capillary refill takes less than 2 seconds.     Findings: No rash.  Neurological:     Mental Status: She is alert and oriented to person, place, and time.  Psychiatric:        Mood and Affect: Mood normal.        Behavior: Behavior normal.    Assessment and Plan:  Meagan Medina is a 43 y.o. female G1P0 presenting to the [facility] Department for an yearly wellness and contraception visit  Contraception counseling: Reviewed options based on patient desire and reproductive life plan. Patient is interested in Hormonal Implant. This was provided to the patient today.   Risks, benefits, and typical effectiveness rates were reviewed.  Questions were answered.   Written information was also given to the patient to review.    The patient will follow up in  1 years for surveillance.  The patient was told to call with any further questions, or with any concerns about this method of contraception.  Emphasized use of condoms 100% of the time for STI prevention.  Educated on ECP and assessed need for ECP. Not indicated based on no unprotected sex since last LMP.   Encounter for initial prescription of implantable subdermal contraceptive Assessment & Plan: Nexplanon Insertion Procedure Patient identified, informed consent performed, consent signed.   Patient does understand that irregular bleeding is a very common side effect  of this medication. She was advised to have backup contraception after placement. Patient was determined to meet WHO criteria for not being pregnant. Appropriate time out taken.  The insertion site was identified 8-10 cm (3-4 inches) from the medial epicondyle of the humerus and 3-5 cm (1.25-2 inches) posterior to (below) the sulcus (groove) between the biceps and triceps muscles of the patient's left arm and marked.  Patient was prepped with alcohol swab and then injected with 3 ml of 1% lidocaine.  Arm was prepped with chlorhexidene, Nexplanon removed from packaging,  Device confirmed in needle, then inserted full length of needle and withdrawn per handbook instructions. Nexplanon was able to palpated in the patient's arm; patient palpated the insert herself. There was minimal blood loss.  Patient insertion site covered with guaze and a pressure bandage to reduce any bruising.  The patient tolerated the procedure well and was given post procedure instructions.    Orders: -     Etonogestrel  Screening for cervical cancer Assessment & Plan: Ms. Siverling accepts PAP smear today. Will follow results.   Orders: -     IGP, Aptima HPV   No follow-ups on file.  Future Appointments  Date Time Provider Department Center  07/20/2023 10:20 AM  Lorre Munroe, NP [facility]  10/23/2023 10:00 AM Baity, Salvadore Oxford, NP [facility]   Clydene Fake, MD

## 2023-07-06 NOTE — Assessment & Plan Note (Signed)
Meagan Medina accepts PAP smear today. Will follow results.

## 2023-07-06 NOTE — Progress Notes (Signed)
Pt is here for PE, pap smear and Nexplanon insertion.  Pt declined STD testing.  FP packet given.  Berdie Ogren, RN

## 2023-07-06 NOTE — Assessment & Plan Note (Signed)
Nexplanon Insertion Procedure Patient identified, informed consent performed, consent signed.   Patient does understand that irregular bleeding is a very common side effect of this medication. She was advised to have backup contraception after placement. Patient was determined to meet WHO criteria for not being pregnant. Appropriate time out taken.  The insertion site was identified 8-10 cm (3-4 inches) from the medial epicondyle of the humerus and 3-5 cm (1.25-2 inches) posterior to (below) the sulcus (groove) between the biceps and triceps muscles of the patient's left arm and marked.  Patient was prepped with alcohol swab and then injected with 3 ml of 1% lidocaine.  Arm was prepped with chlorhexidene, Nexplanon removed from packaging,  Device confirmed in needle, then inserted full length of needle and withdrawn per handbook instructions. Nexplanon was able to palpated in the patient's arm; patient palpated the insert herself. There was minimal blood loss.  Patient insertion site covered with guaze and a pressure bandage to reduce any bruising.  The patient tolerated the procedure well and was given post procedure instructions.

## 2023-07-16 LAB — IGP, APTIMA HPV
HPV Aptima: NEGATIVE
PAP Smear Comment: 0

## 2023-07-20 ENCOUNTER — Ambulatory Visit: Payer: MEDICAID | Admitting: Internal Medicine

## 2023-07-26 ENCOUNTER — Emergency Department
Admission: EM | Admit: 2023-07-26 | Discharge: 2023-07-26 | Disposition: A | Discharge status: Home / Self Care | Payer: MEDICAID | Attending: Emergency Medicine | Admitting: Emergency Medicine

## 2023-07-26 ENCOUNTER — Emergency Department: Payer: MEDICAID

## 2023-07-26 ENCOUNTER — Other Ambulatory Visit: Payer: Self-pay

## 2023-07-26 DIAGNOSIS — R059 Cough, unspecified: Secondary | ICD-10-CM | POA: Diagnosis present

## 2023-07-26 DIAGNOSIS — J069 Acute upper respiratory infection, unspecified: Secondary | ICD-10-CM | POA: Diagnosis not present

## 2023-07-26 LAB — RESP PANEL BY RT-PCR (RSV, FLU A&B, COVID)  RVPGX2
Influenza A by PCR: NEGATIVE
Influenza B by PCR: NEGATIVE
Resp Syncytial Virus by PCR: NEGATIVE
SARS Coronavirus 2 by RT PCR: NEGATIVE

## 2023-07-26 LAB — GROUP A STREP BY PCR: Group A Strep by PCR: NOT DETECTED

## 2023-07-26 NOTE — Discharge Instructions (Addendum)
 Your evaluated in the ED for cough and loss of appetite.  Your respiratory panel which includes COVID, influenza and RSV is negative.  Your rapid strep test is negative.  Your chest x-ray is pending but you are wanting to leave prior to results being read by the radiologist. This to be a viral upper respiratory infection.  It is important to stay hydrated and eat small, easy-to-digest foods when you have URI.  Continue to monitor for your symptoms.  Follow-up with your primary care provider in 1 week.

## 2023-07-26 NOTE — ED Triage Notes (Signed)
 Pt arrives with c/o cough, runny nose, and decreased appetite that started a few days ago. Pt denies fevers.

## 2023-07-26 NOTE — ED Provider Notes (Signed)
 [facility] Emergency Department Provider Note     Event Date/Time   First MD Initiated Contact with Patient 07/26/23 1529     (approximate)   History   URI   HPI  Meagan Medina is a 43 y.o. female with a history of GERD presents to the ED for evaluation of cough, runny nose and loss of appetite x 2 days.  Patient reports she is able to tolerate food orally but just does not have the desire to eat.  She denies weight loss.  Associated symptoms include sore throat.  Patient reports her daughter has similar symptoms and was diagnosed with strep pharyngitis.  Patient denies fever, nausea and vomiting, chest pain, abdominal pain and diarrhea.  No other complaints at this time.     Physical Exam   Triage Vital Signs: ED Triage Vitals [07/26/23 1213]  Encounter Vitals Group     BP 120/78     Systolic BP Percentile      Diastolic BP Percentile      Pulse Rate (!) 107     Resp 19     Temp 98.4 F (36.9 C)     Temp Source Oral     SpO2 97 %     Weight 187 lb (84.8 kg)     Height      Head Circumference      Peak Flow      Pain Score 0     Pain Loc      Pain Education      Exclude from Growth Chart     Most recent vital signs: Vitals:   07/26/23 1213  BP: 120/78  Pulse: (!) 107  Resp: 19  Temp: 98.4 F (36.9 C)  SpO2: 97%   General: Alert and oriented. INAD.  Nontoxic appearing.    Head:  NCAT.  Eyes:  PERRLA. EOMI.  Nose:   Mucosa is moist. No rhinorrhea. Throat: Oropharynx clear. No erythema or exudates. Tonsils not enlarged. Uvula is midline. CV:  Good peripheral perfusion. RRR. No peripheral edema.  RESP:  Normal effort. LCTAB.   ED Results / Procedures / Treatments   Labs (all labs ordered are listed, but only abnormal results are displayed) Labs Reviewed  GROUP A STREP BY PCR  RESP PANEL BY RT-PCR (RSV, FLU A&B, COVID)  [id number]   RADIOLOGY  I personally viewed and evaluated these images as part of my medical decision  making, as well as reviewing the written report by the radiologist.  ED Provider Interpretation: No focal consolidation noted.  Chest x-ray is comparatively similar to previous chest x-ray.  DG Chest 1 View Result Date: 07/26/2023 CLINICAL DATA:  Cough. EXAM: CHEST  1 VIEW COMPARISON:  Chest x-ray dated December 04, 2009. FINDINGS: The heart size and mediastinal contours are within normal limits. Both lungs are clear. The visualized skeletal structures are unremarkable. IMPRESSION: No active disease. Electronically Signed   By: Obie Dredge M.D.   On: 07/26/2023 16:39    PROCEDURES:  Critical Care performed: No  Procedures  MEDICATIONS ORDERED IN ED: Medications - No data to display   IMPRESSION / MDM / ASSESSMENT AND PLAN / ED COURSE  I reviewed the triage vital signs and the nursing notes.                             Clinical Course as of 07/26/23 1824  Thu Jul 26, 2023  1632  Patient is not wanting to wait for chest x-ray reading and would like to be discharged.  On my interpretation I do not see focal consolidation indicating pneumonia.  Patient is advised to follow-up closely with her primary care physician for further evaluation.  Otherwise patient is in stable condition for discharge. [MH]    Clinical Course User Index [MH] Kern Reap A, PA-C   43 y.o. female presents to the emergency department for evaluation and treatment of acute flulike symptoms and decreased appetite. See HPI for further details.   Differential diagnosis includes, but is not limited to COVID, influenza, RSV, strep pharyngitis, rhinitis, PNA  Patient's presentation is most consistent with acute complicated illness / injury requiring diagnostic workup.  Patient is alert and oriented.  She is hemodynamically stable with a mildly elevated pulse of 107.  Physical exam findings are overall reassuring.  Respiratory panel and rapid strep test is negative.  Chest x-ray is reassuring.  Patient declined pain  medication in the ED.  Symptomatic care treatment education provided.  Patient is encouraged to follow-up with her primary care as needed.  The patient is a stable condition at discharge.  FINAL CLINICAL IMPRESSION(S) / ED DIAGNOSES   Final diagnoses:  Viral upper respiratory tract infection     Rx / DC Orders   ED Discharge Orders     None        Note:  This document was prepared using Dragon voice recognition software and may include unintentional dictation errors.    Romeo Apple, Dalana Pfahler A, PA-C 07/26/23 1826    Janith Lima, MD 07/27/23 [phone]

## 2023-08-07 ENCOUNTER — Ambulatory Visit: Payer: MEDICAID | Admitting: Internal Medicine

## 2023-08-07 ENCOUNTER — Encounter: Payer: Self-pay | Admitting: Internal Medicine

## 2023-08-07 VITALS — BP 118/70 | Ht 65.0 in | Wt 189.6 lb

## 2023-08-07 DIAGNOSIS — B9789 Other viral agents as the cause of diseases classified elsewhere: Secondary | ICD-10-CM | POA: Diagnosis not present

## 2023-08-07 DIAGNOSIS — J329 Chronic sinusitis, unspecified: Secondary | ICD-10-CM | POA: Diagnosis not present

## 2023-08-07 MED ORDER — PREDNISONE 10 MG PO TABS: ORAL_TABLET | ORAL | 0 refills | Status: DC

## 2023-08-07 NOTE — Progress Notes (Signed)
 Subjective:    Patient ID: Meagan Medina, female    DOB: 1980/11/21, 43 y.o.   MRN: [id number]  HPI  Discussed the use of AI scribe software for clinical note transcription with the patient, who gave verbal consent to proceed.  Meagan Medina is a 43 year old female with diabetes who presents with ear pain and sinus pressure following up from an ER visit on 3/6.  She has been experiencing bilateral ear pain, sinus pressure, and nasal congestion for approximately three weeks. Over-the-counter medications such as alka-seltzer have not provided relief.  On March 6th, she visited the emergency room where evaluations for flu, cold, and strep throat were negative. A chest X-ray was performed and was normal. No medications were prescribed at that time.  She describes her nasal discharge as clear. No headache, sore throat, shortness of breath, nausea, vomiting, diarrhea, or current fever. Earlier in the illness, she experienced fever, chills, and body aches, initially suspecting the flu however the symptoms have not resolved.  She has a history of allergies, typically experiencing symptoms in April and May, but notes that her current symptoms are more severe than usual. She is allergic to ibuprofen.       Review of Systems   Past Medical History:  Diagnosis Date   Abnormal Pap smear of cervix    Depression    GERD (gastroesophageal reflux disease)     Current Outpatient Medications  Medication Sig Dispense Refill   ARIPiprazole (ABILIFY) 5 MG tablet Take 1 tablet  by mouth daily. 30 tablet 0   atorvastatin (LIPITOR) 40 MG tablet Take 1 tablet (40 mg total) by mouth daily. 90 tablet 0   famotidine (PEPCID) 10 MG tablet Take 1 tablet (10 mg total) by mouth 2 (two) times daily. 180 tablet 1   No current facility-administered medications for this visit.    Allergies  Allergen Reactions   Ibuprofen Nausea And Vomiting and Other (See Comments)    Family History  Problem Relation  Age of Onset   Hypertension Mother    Hypertension Father    Kidney disease Father    Healthy Sister    Healthy Sister    Healthy Sister    Healthy Sister    Breast cancer Neg Hx    Colon cancer Neg Hx     Social History   Socioeconomic History   Marital status: Single    Spouse name: Not on file   Number of children: Not on file   Years of education: Not on file   Highest education level: 9th grade  Occupational History   Not on file  Tobacco Use   Smoking status: Former    Current packs/day: 0.00    Types: Cigarettes    Start date: 2002    Quit date: 2003    Years since quitting: 22.2   Smokeless tobacco: Never  Vaping Use   Vaping status: Never Used  Substance and Sexual Activity   Alcohol use: Yes    Alcohol/week: 1.0 standard drink of alcohol    Types: 1 Cans of beer per week    Comment: "rarely"   Drug use: Not Currently    Types: Marijuana    Comment: last use age 1   Sexual activity: Not Currently    Partners: Male    Birth control/protection: Condom  Other Topics Concern   Not on file  Social History Narrative   Not on file   Social Drivers of Health   Financial  Resource Strain: Low Risk  (07/16/2023)   Overall Financial Resource Strain (CARDIA)    Difficulty of Paying Living Expenses: Not hard at all  Food Insecurity: No Food Insecurity (07/16/2023)   Hunger Vital Sign    Worried About Running Out of Food in the Last Year: Never true    Ran Out of Food in the Last Year: Never true  Transportation Needs: No Transportation Needs (07/16/2023)   PRAPARE - Administrator, Civil Service (Medical): No    Lack of Transportation (Non-Medical): No  Recent Concern: Transportation Needs - Unmet Transportation Needs (04/22/2023)   PRAPARE - Transportation    Lack of Transportation (Medical): No    Lack of Transportation (Non-Medical): Yes  Physical Activity: Insufficiently Active (07/16/2023)   Exercise Vital Sign    Days of Exercise per Week: 3  days    Minutes of Exercise per Session: 40 min  Stress: No Stress Concern Present (07/16/2023)   [employer] of Occupational Health - Occupational Stress Questionnaire    Feeling of Stress : Not at all  Social Connections: Socially Isolated (07/16/2023)   Social Connection and Isolation Panel [NHANES]    Frequency of Communication with Friends and Family: Three times a week    Frequency of Social Gatherings with Friends and Family: Twice a week    Attends Religious Services: Never    Database administrator or Organizations: No    Attends Engineer, structural: Not on file    Marital Status: Never married  Intimate Partner Violence: Not At Risk (07/06/2023)   Humiliation, Afraid, Rape, and Kick questionnaire    Fear of Current or Ex-Partner: No    Emotionally Abused: No    Physically Abused: No    Sexually Abused: No     Constitutional: Denies fever, malaise, fatigue, headache or abrupt weight changes.  HEENT: Pt reports sinus pressure, runny nose, nasal congestion, ear pain.  Denies eye pain, eye redness, ringing in the ears, wax buildup, bloody nose, or sore throat. Respiratory: Denies difficulty breathing, shortness of breath, cough or sputum production.   Cardiovascular: Denies chest pain, chest tightness, palpitations or swelling in the hands or feet.  Gastrointestinal: Denies abdominal pain, bloating, constipation, diarrhea or blood in the stool.  Musculoskeletal: Patient reports joint pain.  Denies decrease in range of motion, difficulty with gait, muscle pain or joint swelling.   No other specific complaints in a complete review of systems (except as listed in HPI above).     Objective:   Physical Exam BP 118/70 (BP Location: Left Arm, Patient Position: Sitting, Cuff Size: Normal)   Ht 5\' 5"  (1.651 m)   Wt 189 lb 9.6 oz (86 kg)   LMP 07/13/2023 (Approximate)   BMI 31.55 kg/m     Wt Readings from Last 3 Encounters:  07/26/23 186 lb 15.2 oz (84.8 kg)   07/06/23 187 lb 3.2 oz (84.9 kg)  04/24/23 189 lb 3.2 oz (85.8 kg)    General: Appears her stated age, obese, in NAD. Skin: Warm, dry and intact.  HEENT: Head: normal shape and size, frontal sinus tenderness noted; Eyes: sclera white, no icterus, conjunctiva pink, PERRLA and EOMs intact; Nose: Mucosa pink and moist, septum midline; Throat: mucosa pink and moist, + PND, no exudate, ulcerations or lesions noted. Neck: No adenopathy noted. Cardiovascular: Normal rate and rhythm. S1,S2 noted.  No murmur, rubs or gallops noted. Pulmonary/Chest: Normal effort and positive vesicular breath sounds. No respiratory distress. No wheezes, rales or ronchi  noted.  Musculoskeletal:  Gait slow and steady with use of walker. Neurological: Alert and oriented.   BMET    Component Value Date/Time   NA 139 01/18/2023 0939   NA 136 12/22/2012 1242   K 4.0 01/18/2023 0939   K 3.7 12/22/2012 1242   CL 106 01/18/2023 0939   CL 107 12/22/2012 1242   CO2 24 01/18/2023 0939   CO2 23 12/22/2012 1242   GLUCOSE 94 01/18/2023 0939   GLUCOSE 89 12/22/2012 1242   BUN 13 01/18/2023 0939   BUN 10 12/22/2012 1242   CREATININE 0.83 01/18/2023 0939   CALCIUM 9.1 01/18/2023 0939   CALCIUM 9.7 12/22/2012 1242   GFRNONAA >60 01/25/2021 0809   GFRNONAA >60 12/22/2012 1242   GFRAA >60 12/22/2012 1242    Lipid Panel     Component Value Date/Time   CHOL 211 (H) 01/18/2023 0939   TRIG 117 01/18/2023 0939   HDL 60 01/18/2023 0939   CHOLHDL 3.5 01/18/2023 0939   LDLCALC 129 (H) 01/18/2023 0939    CBC    Component Value Date/Time   WBC 4.7 01/18/2023 0939   RBC 4.56 01/18/2023 0939   HGB 10.0 (L) 01/18/2023 0939   HGB 11.4 (L) 07/04/2013 1120   HCT 33.2 (L) 01/18/2023 0939   HCT 32.1 (L) 07/08/2013 0616   PLT 300 01/18/2023 0939   PLT 264 07/04/2013 1120   MCV 72.8 (L) 01/18/2023 0939   MCV 74 (L) 07/04/2013 1120   MCH 21.9 (L) 01/18/2023 0939   MCHC 30.1 (L) 01/18/2023 0939   RDW 16.3 (H) 01/18/2023  0939   RDW 15.5 (H) 07/04/2013 1120   LYMPHSABS 1.0 07/04/2013 1120   MONOABS 0.6 07/04/2013 1120   EOSABS 0.0 07/04/2013 1120   BASOSABS 0.0 07/04/2013 1120    Hgb A1C Lab Results  Component Value Date   HGBA1C 6.5 (A) 04/24/2023            Assessment & Plan:   Assessment and Plan    Viral Sinusitis ER notes, labs and imaging reviewed.  Symptoms consistent with non-bacterial sinusitis, likely due to seasonal allergies or viral infection. Bacterial infection unlikely. - Prescribed prednisone 6-day taper. - Recommended Zyrtec 10 mg daily for 10 days. - Advised Flonase nasal spray use. - Follow up by Friday if no improvement, consider antibiotic.    RTC in 3 months, follow-up chronic conditions Nicki Reaper, NP

## 2023-08-07 NOTE — Patient Instructions (Signed)
Sinus Pain  Sinus pain happens when your sinuses get swollen or blocked (clogged). Sinuses are spaces behind the bones of your face and forehead. You may feel pain or pressure in your face, forehead, ears, or upper teeth. Sinus pain can be mild or very bad. What are the causes? Sinus pain can result from conditions that affect your sinuses. Common causes include: Colds. Sinus infections. Allergies. What are the signs or symptoms? The main symptom of this condition is pain or pressure in your face, forehead, ears, or upper teeth. People who have sinus pain often have other symptoms, such as: Stuffed up or runny nose. Fever. Not being able to smell. Headache. Weather changes can make your symptoms worse. How is this treated? Treatment for this condition depends on the cause. Sinus pain caused by: A sinus infection may be treated with antibiotic medicine. A stuffy nose may be helped by rinsing out the nose and sinuses with a salt water solution. Allergies may be helped by allergy medicines and nasal sprays. Surgery may be needed in some cases if other treatments do not help. Follow these instructions at home: General instructions If told: Apply a warm, moist washcloth to your face. This can help to lessen pain. Use a nasal salt water wash. Follow the directions on the bottle or box. Hydrate and humidify Drink enough water to keep your pee (urine) pale yellow. Use a humidifier if your home is dry. Breathe in steam for 10-15 minutes, 3-4 times a day or as told by your doctor. You can do this in the bathroom while a hot shower is running. Try not to spend time in cool or dry air. Medicines  Take over-the-counter and prescription medicines only as told by your doctor. If you were prescribed an antibiotic medicine, take it as told by your doctor. Do not stop taking it even if you start to feel better. Use a nose spray if your nose feels full of mucus (congested). Contact a doctor if: You  get more than one headache a week. Light or sound bothers you. You have a fever. You feel sick to your stomach (nauseous) or you vomit. Your headaches do not get better with treatment. Get help right away if: You have trouble seeing. You suddenly have very bad pain in your face or head. You start to have quick, sudden movements or shaking that you cannot control (seizure). You are confused. You have a stiff neck. Summary Sinus pain happens when your sinuses get swollen or blocked (clogged). Sinuses are spaces behind the bones of your face and forehead. You may feel pain or pressure in your face, forehead, ears, or upper teeth. Take over-the-counter and prescription medicines only as told by your doctor. If told, apply a warm, moist washcloth to your face. This can help to lessen pain. This information is not intended to replace advice given to you by your health care provider. Make sure you discuss any questions you have with your health care provider. Document Revised: 04/10/2021 Document Reviewed: 04/10/2021 Elsevier Patient Education  2024 [employer].

## 2023-08-13 ENCOUNTER — Other Ambulatory Visit: Payer: Self-pay | Admitting: Internal Medicine

## 2023-08-14 ENCOUNTER — Other Ambulatory Visit (HOSPITAL_COMMUNITY): Payer: Self-pay

## 2023-08-14 MED ORDER — ATORVASTATIN CALCIUM 40 MG PO TABS
40.0000 mg | ORAL_TABLET | Freq: Every day | ORAL | 1 refills | Status: DC
  Filled 2023-08-14: qty 90, 90d supply, fill #0
  Filled 2023-11-13: qty 90, 90d supply, fill #1

## 2023-08-14 NOTE — Telephone Encounter (Signed)
 Requested Prescriptions  Pending Prescriptions Disp Refills   atorvastatin (LIPITOR) 40 MG tablet 90 tablet 1    Sig: Take 1 tablet (40 mg total) by mouth daily.     Cardiovascular:  Antilipid - Statins Failed - 08/14/2023  5:34 PM      Failed - Lipid Panel in normal range within the last 12 months    Cholesterol  Date Value Ref Range Status  01/18/2023 211 (H) <200 mg/dL Final   LDL Cholesterol (Calc)  Date Value Ref Range Status  01/18/2023 129 (H) mg/dL (calc) Final    Comment:    Reference range: <100 . Desirable range <100 mg/dL for primary prevention;   <70 mg/dL for patients with CHD or diabetic patients  with > or = 2 CHD risk factors. Marland Kitchen LDL-C is now calculated using the Martin-Hopkins  calculation, which is a validated novel method providing  better accuracy than the Friedewald equation in the  estimation of LDL-C.  Horald Pollen et al. Lenox Ahr. 1610;960(45): 2061-2068  (http://education.[facility].com/faq/FAQ164)    HDL  Date Value Ref Range Status  01/18/2023 60 > OR = 50 mg/dL Final   Triglycerides  Date Value Ref Range Status  01/18/2023 117 <150 mg/dL Final         Passed - Patient is not pregnant      Passed - Valid encounter within last 12 months    Recent Outpatient Visits           3 months ago Type 2 diabetes mellitus with hyperglycemia, without long-term current use of insulin [facility])   [facility] [place], [place] W, NP   5 months ago Type 2 diabetes mellitus with hyperglycemia, without long-term current use of insulin [facility])   [facility] [place], Salvadore Oxford, NP   6 months ago Encounter for general adult medical examination with abnormal findings   [facility] [place], Salvadore Oxford, NP   1 year ago Prediabetes   [facility] [place], Salvadore Oxford, NP   1 year ago Eczema, unspecified type   [facility], Salvadore Oxford, NP        Future Appointments             In 2 months Baity, Salvadore Oxford, NP [place] [facility], [facility]

## 2023-08-15 ENCOUNTER — Other Ambulatory Visit (HOSPITAL_COMMUNITY): Payer: Self-pay

## 2023-09-28 ENCOUNTER — Other Ambulatory Visit: Payer: Self-pay | Admitting: Internal Medicine

## 2023-09-28 DIAGNOSIS — N6320 Unspecified lump in the left breast, unspecified quadrant: Secondary | ICD-10-CM

## 2023-09-28 DIAGNOSIS — N6310 Unspecified lump in the right breast, unspecified quadrant: Secondary | ICD-10-CM

## 2023-09-28 DIAGNOSIS — N63 Unspecified lump in unspecified breast: Secondary | ICD-10-CM

## 2023-10-09 ENCOUNTER — Other Ambulatory Visit: Payer: Self-pay | Admitting: Medical Genetics

## 2023-10-16 ENCOUNTER — Ambulatory Visit
Admission: RE | Admit: 2023-10-16 | Discharge: 2023-10-16 | Disposition: A | Discharge status: Home / Self Care | Payer: MEDICAID | Source: Ambulatory Visit | Attending: Internal Medicine | Admitting: Internal Medicine

## 2023-10-16 DIAGNOSIS — N6322 Unspecified lump in the left breast, upper inner quadrant: Secondary | ICD-10-CM | POA: Diagnosis not present

## 2023-10-16 DIAGNOSIS — N6310 Unspecified lump in the right breast, unspecified quadrant: Secondary | ICD-10-CM | POA: Diagnosis present

## 2023-10-16 DIAGNOSIS — N6314 Unspecified lump in the right breast, lower inner quadrant: Secondary | ICD-10-CM | POA: Diagnosis not present

## 2023-10-16 DIAGNOSIS — N6320 Unspecified lump in the left breast, unspecified quadrant: Secondary | ICD-10-CM | POA: Insufficient documentation

## 2023-10-16 DIAGNOSIS — N63 Unspecified lump in unspecified breast: Secondary | ICD-10-CM

## 2023-10-23 ENCOUNTER — Ambulatory Visit: Payer: MEDICAID | Admitting: Internal Medicine

## 2023-10-23 ENCOUNTER — Encounter: Payer: Self-pay | Admitting: Internal Medicine

## 2023-10-23 VITALS — BP 110/74 | Ht 65.0 in | Wt 193.0 lb

## 2023-10-23 DIAGNOSIS — E1169 Type 2 diabetes mellitus with other specified complication: Secondary | ICD-10-CM | POA: Diagnosis not present

## 2023-10-23 DIAGNOSIS — E785 Hyperlipidemia, unspecified: Secondary | ICD-10-CM

## 2023-10-23 DIAGNOSIS — M1651 Unilateral post-traumatic osteoarthritis, right hip: Secondary | ICD-10-CM | POA: Diagnosis not present

## 2023-10-23 DIAGNOSIS — Z6832 Body mass index (BMI) 32.0-32.9, adult: Secondary | ICD-10-CM

## 2023-10-23 DIAGNOSIS — D500 Iron deficiency anemia secondary to blood loss (chronic): Secondary | ICD-10-CM

## 2023-10-23 DIAGNOSIS — E6609 Other obesity due to excess calories: Secondary | ICD-10-CM

## 2023-10-23 DIAGNOSIS — K219 Gastro-esophageal reflux disease without esophagitis: Secondary | ICD-10-CM

## 2023-10-23 DIAGNOSIS — E1165 Type 2 diabetes mellitus with hyperglycemia: Secondary | ICD-10-CM

## 2023-10-23 DIAGNOSIS — E66811 Obesity, class 1: Secondary | ICD-10-CM

## 2023-10-23 DIAGNOSIS — F39 Unspecified mood [affective] disorder: Secondary | ICD-10-CM

## 2023-10-23 NOTE — Assessment & Plan Note (Signed)
 A1c and urine microalbumin today Encourage low-carb diet and exercise for weight loss Not medicated Encouraged routine eye exam Encouraged routine foot exam She declines immunizations

## 2023-10-23 NOTE — Assessment & Plan Note (Signed)
Encourage weight loss as this can help reduce joint pain Continue naproxen as needed

## 2023-10-23 NOTE — Assessment & Plan Note (Signed)
 Encourage diet and exercise for weight loss

## 2023-10-23 NOTE — Assessment & Plan Note (Signed)
Encouraged her to avoid foods that trigger reflux Encouraged weight loss as this can help reduce reflux symptoms Continue famotidine

## 2023-10-23 NOTE — Progress Notes (Signed)
 Subjective:    Patient ID: Meagan Medina, female    DOB: 25-Oct-1980, 43 y.o.   MRN: [id number]  HPI  Patient presents to clinic today for 16-month follow-up of chronic conditions.  HLD: Her last LDL was 129, triglycerides 117, 12/2022.  She denies myalgias on atorvastatin .  She does not consume a low-fat diet.  DM2: Her last A1c was 6.5%, 04/2023.  She is not taking any oral diabetic medication at this time.  She does not check her sugars.  She checks her feet routinely.  Her last eye exam was 2024.  Flu never.  Pneumovax never.  COVID never.  GERD: Triggered by coffee.  She denies breakthrough on famotidine .  There is no upper GI on file.  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  She takes naproxen  as needed with good relief of symptoms.  She does not follow with orthopedics.  Anemia: Her last H/H was 10/33.2, 12/2022.  She is not currently taking any oral iron due to constipation.  She does not follow with hematology.  Anxiety and depression: Chronic, she is no longer taking abilify .  She is no longer following with a psychiatrist and therapist at this time.  She denies SI/HI.  Review of Systems   Past Medical History:  Diagnosis Date   Abnormal Pap smear of cervix    Depression    GERD (gastroesophageal reflux disease)     Current Outpatient Medications  Medication Sig Dispense Refill   ARIPiprazole  (ABILIFY ) 5 MG tablet Take 1 tablet  by mouth daily. 30 tablet 0   atorvastatin  (LIPITOR) 40 MG tablet Take 1 tablet (40 mg total) by mouth daily. 90 tablet 1   famotidine  (PEPCID ) 10 MG tablet Take 1 tablet (10 mg total) by mouth 2 (two) times daily. 180 tablet 1   predniSONE  (DELTASONE ) 10 MG tablet Take 6 tabs on day 1, 5 tabs on day 2, 4 tabs on day 3, 3 tabs on day 4, 2 tabs on day 5, 1 tab on day 6 21 tablet 0   No current facility-administered medications for this visit.    Allergies  Allergen Reactions   Ibuprofen Nausea And Vomiting and Other (See Comments)    Family History   Problem Relation Age of Onset   Hypertension Mother    Hypertension Father    Kidney disease Father    Healthy Sister    Healthy Sister    Healthy Sister    Healthy Sister    Breast cancer Neg Hx    Colon cancer Neg Hx     Social History   Socioeconomic History   Marital status: Single    Spouse name: Not on file   Number of children: Not on file   Years of education: Not on file   Highest education level: 9th grade  Occupational History   Not on file  Tobacco Use   Smoking status: Former    Current packs/day: 0.00    Types: Cigarettes    Start date: 2002    Quit date: 2003    Years since quitting: 22.4   Smokeless tobacco: Never  Vaping Use   Vaping status: Never Used  Substance and Sexual Activity   Alcohol use: Yes    Alcohol/week: 1.0 standard drink of alcohol    Types: 1 Cans of beer per week    Comment: "rarely"   Drug use: Not Currently    Types: Marijuana    Comment: last use age 2   Sexual activity:  Not Currently    Partners: Male    Birth control/protection: Condom  Other Topics Concern   Not on file  Social History Narrative   Not on file   Social Drivers of Health   Financial Resource Strain: Low Risk  (07/16/2023)   Overall Financial Resource Strain (CARDIA)    Difficulty of Paying Living Expenses: Not hard at all  Food Insecurity: No Food Insecurity (07/16/2023)   Hunger Vital Sign    Worried About Running Out of Food in the Last Year: Never true    Ran Out of Food in the Last Year: Never true  Transportation Needs: No Transportation Needs (07/16/2023)   PRAPARE - Administrator, Civil Service (Medical): No    Lack of Transportation (Non-Medical): No  Recent Concern: Transportation Needs - Unmet Transportation Needs (04/22/2023)   PRAPARE - Transportation    Lack of Transportation (Medical): No    Lack of Transportation (Non-Medical): Yes  Physical Activity: Insufficiently Active (07/16/2023)   Exercise Vital Sign    Days of  Exercise per Week: 3 days    Minutes of Exercise per Session: 40 min  Stress: No Stress Concern Present (07/16/2023)   [employer] of Occupational Health - Occupational Stress Questionnaire    Feeling of Stress : Not at all  Social Connections: Socially Isolated (07/16/2023)   Social Connection and Isolation Panel [NHANES]    Frequency of Communication with Friends and Family: Three times a week    Frequency of Social Gatherings with Friends and Family: Twice a week    Attends Religious Services: Never    Database administrator or Organizations: No    Attends Engineer, structural: Not on file    Marital Status: Never married  Intimate Partner Violence: Not At Risk (07/06/2023)   Humiliation, Afraid, Rape, and Kick questionnaire    Fear of Current or Ex-Partner: No    Emotionally Abused: No    Physically Abused: No    Sexually Abused: No     Constitutional: Denies fever, malaise, fatigue, headache or abrupt weight changes.  HEENT: Denies eye pain, eye redness, ear pain, ringing in the ears, wax buildup, runny nose, nasal congestion, bloody nose, or sore throat. Respiratory: Denies difficulty breathing, shortness of breath, cough or sputum production.   Cardiovascular: Denies chest pain, chest tightness, palpitations or swelling in the hands or feet.  Gastrointestinal: Denies abdominal pain, bloating, constipation, diarrhea or blood in the stool.  GU: Denies urgency, frequency, pain with urination, burning sensation, blood in urine, odor or discharge. Musculoskeletal: Patient reports chronic right hip pain, difficulty with gait.  Denies decrease in range of motion, muscle pain or joint swelling.  Skin: Denies redness, rashes, lesions or ulcercations.  Neurological: Denies dizziness, difficulty with memory, difficulty with speech or problems with balance and coordination.  Psych: Patient has a history of anxiety and depression.  Denies SI/HI.  No other specific complaints in  a complete review of systems (except as listed in HPI above).     Objective:   Physical Exam  BP 110/74 (BP Location: Left Arm, Patient Position: Sitting, Cuff Size: Normal)   Ht 5\' 5"  (1.651 m)   Wt 193 lb (87.5 kg)   LMP 09/24/2023 (Approximate)   BMI 32.12 kg/m    Wt Readings from Last 3 Encounters:  08/07/23 189 lb 9.6 oz (86 kg)  07/26/23 186 lb 15.2 oz (84.8 kg)  07/06/23 187 lb 3.2 oz (84.9 kg)    General: Appears her  stated age, obese, in NAD. Skin: Warm, dry and intact.  No ulcerations noted. HEENT: Head: normal shape and size; Eyes: sclera white, no icterus, conjunctiva pink, PERRLA and EOMs intact;  Cardiovascular: Normal rate and rhythm. S1,S2 noted.  No murmur, rubs or gallops noted. Pulmonary/Chest: Normal effort and positive vesicular breath sounds. No respiratory distress. No wheezes, rales or ronchi noted.  Musculoskeletal: Gait slow and steady with use of rolling walker. Neurological: Alert and oriented.  Psychiatric: Mood and affect flat. Behavior is normal. Judgment and thought content normal.    BMET    Component Value Date/Time   NA 139 01/18/2023 0939   NA 136 12/22/2012 1242   K 4.0 01/18/2023 0939   K 3.7 12/22/2012 1242   CL 106 01/18/2023 0939   CL 107 12/22/2012 1242   CO2 24 01/18/2023 0939   CO2 23 12/22/2012 1242   GLUCOSE 94 01/18/2023 0939   GLUCOSE 89 12/22/2012 1242   BUN 13 01/18/2023 0939   BUN 10 12/22/2012 1242   CREATININE 0.83 01/18/2023 0939   CALCIUM  9.1 01/18/2023 0939   CALCIUM  9.7 12/22/2012 1242   GFRNONAA >60 01/25/2021 0809   GFRNONAA >60 12/22/2012 1242   GFRAA >60 12/22/2012 1242    Lipid Panel     Component Value Date/Time   CHOL 211 (H) 01/18/2023 0939   TRIG 117 01/18/2023 0939   HDL 60 01/18/2023 0939   CHOLHDL 3.5 01/18/2023 0939   LDLCALC 129 (H) 01/18/2023 0939    CBC    Component Value Date/Time   WBC 4.7 01/18/2023 0939   RBC 4.56 01/18/2023 0939   HGB 10.0 (L) 01/18/2023 0939   HGB 11.4  (L) 07/04/2013 1120   HCT 33.2 (L) 01/18/2023 0939   HCT 32.1 (L) 07/08/2013 0616   PLT 300 01/18/2023 0939   PLT 264 07/04/2013 1120   MCV 72.8 (L) 01/18/2023 0939   MCV 74 (L) 07/04/2013 1120   MCH 21.9 (L) 01/18/2023 0939   MCHC 30.1 (L) 01/18/2023 0939   RDW 16.3 (H) 01/18/2023 0939   RDW 15.5 (H) 07/04/2013 1120   LYMPHSABS 1.0 07/04/2013 1120   MONOABS 0.6 07/04/2013 1120   EOSABS 0.0 07/04/2013 1120   BASOSABS 0.0 07/04/2013 1120    Hgb A1C Lab Results  Component Value Date   HGBA1C 6.5 (A) 04/24/2023            Assessment & Plan:     RTC in 3 months, for your annual exam Helayne Lo, NP

## 2023-10-23 NOTE — Assessment & Plan Note (Signed)
 Will discontinue abilify  due to nonuse She is no longer seeing a psychiatrist and therapist Support offered

## 2023-10-23 NOTE — Assessment & Plan Note (Signed)
 She does not want to take oral iron due to constipation CBC today

## 2023-10-23 NOTE — Patient Instructions (Signed)

## 2023-10-23 NOTE — Assessment & Plan Note (Signed)
 C-Met and lipid profile today Continue atorvastatin Reinforced low-fat diet

## 2023-10-24 ENCOUNTER — Ambulatory Visit: Payer: Self-pay | Admitting: Internal Medicine

## 2023-10-24 ENCOUNTER — Other Ambulatory Visit: Payer: MEDICAID

## 2023-10-24 ENCOUNTER — Encounter: Payer: Self-pay | Admitting: Internal Medicine

## 2023-10-24 DIAGNOSIS — D500 Iron deficiency anemia secondary to blood loss (chronic): Secondary | ICD-10-CM

## 2023-10-24 LAB — COMPREHENSIVE METABOLIC PANEL WITH GFR
AG Ratio: 1.6 (calc) (ref 1.0–2.5)
ALT: 10 U/L (ref 6–29)
AST: 12 U/L (ref 10–35)
Albumin: 4 g/dL (ref 3.6–5.1)
Alkaline phosphatase (APISO): 55 U/L (ref 31–125)
BUN: 11 mg/dL (ref 7–25)
CO2: 25 mmol/L (ref 20–32)
Calcium: 9.1 mg/dL (ref 8.6–10.2)
Chloride: 105 mmol/L (ref 98–110)
Creat: 0.83 mg/dL (ref 0.50–0.99)
Globulin: 2.5 g/dL (ref 1.9–3.7)
Glucose, Bld: 96 mg/dL (ref 65–99)
Potassium: 4 mmol/L (ref 3.5–5.3)
Sodium: 137 mmol/L (ref 135–146)
Total Bilirubin: 0.3 mg/dL (ref 0.2–1.2)
Total Protein: 6.5 g/dL (ref 6.1–8.1)
eGFR: 89 mL/min/{1.73_m2} (ref >=60)

## 2023-10-24 LAB — LIPID PANEL
Cholesterol: 159 mg/dL (ref <200)
HDL: 61 mg/dL (ref >=50)
LDL Cholesterol (Calc): 77 mg/dL
Non-HDL Cholesterol (Calc): 98 mg/dL (ref <130)
Total CHOL/HDL Ratio: 2.6 (calc) (ref <5.0)
Triglycerides: 124 mg/dL (ref <150)

## 2023-10-24 LAB — MICROALBUMIN / CREATININE URINE RATIO
Creatinine, Urine: 26 mg/dL (ref 20–275)
Microalb, Ur: <0.2 mg/dL

## 2023-10-24 LAB — CBC
HCT: 31 % — ABNORMAL LOW (ref 35.0–45.0)
Hemoglobin: 8.6 g/dL — ABNORMAL LOW (ref 11.7–15.5)
MCH: 19.9 pg — ABNORMAL LOW (ref 27.0–33.0)
MCHC: 27.7 g/dL — ABNORMAL LOW (ref 32.0–36.0)
MCV: 71.6 fL — ABNORMAL LOW (ref 80.0–100.0)
MPV: 11.2 fL (ref 7.5–12.5)
Platelets: 311 10*3/uL (ref 140–400)
RBC: 4.33 10*6/uL (ref 3.80–5.10)
RDW: 17.2 % — ABNORMAL HIGH (ref 11.0–15.0)
WBC: 5.3 10*3/uL (ref 3.8–10.8)

## 2023-10-24 LAB — HEMOGLOBIN A1C
Hgb A1c MFr Bld: 6.3 % — ABNORMAL HIGH (ref <5.7)
Mean Plasma Glucose: 134 mg/dL
eAG (mmol/L): 7.4 mmol/L

## 2023-11-01 ENCOUNTER — Inpatient Hospital Stay: Payer: MEDICAID

## 2023-11-01 ENCOUNTER — Inpatient Hospital Stay: Payer: MEDICAID | Admitting: Oncology

## 2023-11-13 ENCOUNTER — Other Ambulatory Visit: Payer: Self-pay

## 2023-11-15 ENCOUNTER — Encounter: Payer: Self-pay | Admitting: Oncology

## 2023-11-15 ENCOUNTER — Inpatient Hospital Stay: Payer: MEDICAID | Attending: Oncology | Admitting: Oncology

## 2023-11-15 ENCOUNTER — Inpatient Hospital Stay: Payer: MEDICAID

## 2024-01-30 ENCOUNTER — Encounter: Payer: MEDICAID | Admitting: Internal Medicine

## 2024-02-09 ENCOUNTER — Other Ambulatory Visit: Payer: Self-pay | Admitting: Internal Medicine

## 2024-02-11 ENCOUNTER — Other Ambulatory Visit: Payer: Self-pay

## 2024-02-11 ENCOUNTER — Other Ambulatory Visit (HOSPITAL_COMMUNITY): Payer: Self-pay

## 2024-02-11 MED ORDER — ATORVASTATIN CALCIUM 40 MG PO TABS
40.0000 mg | ORAL_TABLET | Freq: Every day | ORAL | 2 refills | Status: AC
  Filled 2024-02-11: qty 90, 90d supply, fill #0
  Filled 2024-05-07: qty 90, 90d supply, fill #1

## 2024-02-11 NOTE — Telephone Encounter (Signed)
 Requested Prescriptions  Pending Prescriptions Disp Refills   atorvastatin  (LIPITOR) 40 MG tablet 90 tablet 2    Sig: Take 1 tablet (40 mg total) by mouth daily.     Cardiovascular:  Antilipid - Statins Failed - 02/11/2024  2:23 PM      Failed - Lipid Panel in normal range within the last 12 months    Cholesterol  Date Value Ref Range Status  10/23/2023 159 <200 mg/dL Final   LDL Cholesterol (Calc)  Date Value Ref Range Status  10/23/2023 77 mg/dL (calc) Final    Comment:    Reference range: <100 . Desirable range <100 mg/dL for primary prevention;   <70 mg/dL for patients with CHD or diabetic patients  with > or = 2 CHD risk factors. SABRA LDL-C is now calculated using the Martin-Hopkins  calculation, which is a validated novel method providing  better accuracy than the Friedewald equation in the  estimation of LDL-C.  Gladis APPLETHWAITE et al. SANDREA. 7986;689(80): 2061-2068  (http://education.[facility].com/faq/FAQ164)    HDL  Date Value Ref Range Status  10/23/2023 61 > OR = 50 mg/dL Final   Triglycerides  Date Value Ref Range Status  10/23/2023 124 <150 mg/dL Final         Passed - Patient is not pregnant      Passed - Valid encounter within last 12 months    Recent Outpatient Visits           3 months ago Type 2 diabetes mellitus with hyperglycemia, without long-term current use of insulin [facility])   [facility] [place], Angeline ORN, NP   6 months ago Viral sinusitis   [place] [facility] [place], Angeline ORN, [place]

## 2024-02-26 ENCOUNTER — Encounter: Payer: Self-pay | Admitting: Internal Medicine

## 2024-02-26 ENCOUNTER — Ambulatory Visit: Payer: MEDICAID | Admitting: Internal Medicine

## 2024-02-26 VITALS — BP 110/74 | HR 85 | Ht 65.0 in | Wt 195.6 lb

## 2024-02-26 DIAGNOSIS — J329 Chronic sinusitis, unspecified: Secondary | ICD-10-CM | POA: Diagnosis not present

## 2024-02-26 DIAGNOSIS — E66811 Obesity, class 1: Secondary | ICD-10-CM

## 2024-02-26 DIAGNOSIS — Z1231 Encounter for screening mammogram for malignant neoplasm of breast: Secondary | ICD-10-CM

## 2024-02-26 DIAGNOSIS — E1165 Type 2 diabetes mellitus with hyperglycemia: Secondary | ICD-10-CM | POA: Diagnosis not present

## 2024-02-26 DIAGNOSIS — Z23 Encounter for immunization: Secondary | ICD-10-CM | POA: Diagnosis not present

## 2024-02-26 DIAGNOSIS — E6609 Other obesity due to excess calories: Secondary | ICD-10-CM

## 2024-02-26 DIAGNOSIS — Z1211 Encounter for screening for malignant neoplasm of colon: Secondary | ICD-10-CM

## 2024-02-26 DIAGNOSIS — B9789 Other viral agents as the cause of diseases classified elsewhere: Secondary | ICD-10-CM | POA: Diagnosis not present

## 2024-02-26 DIAGNOSIS — Z0001 Encounter for general adult medical examination with abnormal findings: Secondary | ICD-10-CM

## 2024-02-26 DIAGNOSIS — J3089 Other allergic rhinitis: Secondary | ICD-10-CM | POA: Diagnosis not present

## 2024-02-26 DIAGNOSIS — Z6832 Body mass index (BMI) 32.0-32.9, adult: Secondary | ICD-10-CM

## 2024-02-26 MED ORDER — PREDNISONE 10 MG PO TABS
ORAL_TABLET | ORAL | 0 refills | Status: AC
Start: 2024-02-26 — End: ?

## 2024-02-26 NOTE — Assessment & Plan Note (Signed)
 Encourage diet and exercise for weight loss

## 2024-02-26 NOTE — Patient Instructions (Signed)

## 2024-02-26 NOTE — Progress Notes (Signed)
 Subjective:    Patient ID: Meagan Medina, female    DOB: 1980/09/25, 43 y.o.   MRN: [id number]  HPI  Patient presents to clinic today for her annual exam. She has been experiencing symptoms for the past week, including stuffy ears, echoing sounds in her ears, sinus pressure, and rhinorrhea. Her chest feels rattly when breathing, accompanied by a productive cough with clear mucus.  She denies sore throat and shortness of breath but reports nausea without vomiting or diarrhea. She also experiences sweating, and temperature fluctuations, feeling both hot and cold.  She has a history of typical seasonal allergies.     Flu: Never Tetanus: 05/2020 COVID: Never Prevnar 20: Never Pap smear: 06/2023 Mammogram: 03/2023 Vision screening: annually Dentist: as needed  Diet: She does eat meat. She consumes fruits and veggies. She does eat some fried foods. She drinks mostly water, dt. soda Exercise: walking  Review of Systems     Past Medical History:  Diagnosis Date   Abnormal Pap smear of cervix    Depression    GERD (gastroesophageal reflux disease)     Current Outpatient Medications  Medication Sig Dispense Refill   atorvastatin  (LIPITOR) 40 MG tablet Take 1 tablet (40 mg total) by mouth daily. 90 tablet 2   etonogestrel  (NEXPLANON ) 68 MG IMPL implant 1 each by Subdermal route once.     famotidine  (PEPCID ) 10 MG tablet Take 1 tablet (10 mg total) by mouth 2 (two) times daily. 180 tablet 1   No current facility-administered medications for this visit.    Allergies  Allergen Reactions   Ibuprofen Nausea And Vomiting and Other (See Comments)    Family History  Problem Relation Age of Onset   Hypertension Mother    Hypertension Father    Kidney disease Father    Healthy Sister    Healthy Sister    Healthy Sister    Healthy Sister    Breast cancer Neg Hx    Colon cancer Neg Hx     Social History   Socioeconomic History   Marital status: Single    Spouse name: Not  on file   Number of children: Not on file   Years of education: Not on file   Highest education level: 9th grade  Occupational History   Not on file  Tobacco Use   Smoking status: Former    Current packs/day: 0.00    Types: Cigarettes    Start date: 2002    Quit date: 2003    Years since quitting: 22.7   Smokeless tobacco: Never  Vaping Use   Vaping status: Never Used  Substance and Sexual Activity   Alcohol use: Yes    Alcohol/week: 1.0 standard drink of alcohol    Types: 1 Cans of beer per week    Comment: rarely   Drug use: Not Currently    Types: Marijuana    Comment: last use age 70   Sexual activity: Not Currently    Partners: Male    Birth control/protection: Condom  Other Topics Concern   Not on file  Social History Narrative   Not on file   Social Drivers of Health   Financial Resource Strain: Low Risk  (02/22/2024)   Overall Financial Resource Strain (CARDIA)    Difficulty of Paying Living Expenses: Not hard at all  Food Insecurity: No Food Insecurity (02/22/2024)   Hunger Vital Sign    Worried About Running Out of Food in the Last Year: Never true  Ran Out of Food in the Last Year: Never true  Transportation Needs: No Transportation Needs (02/22/2024)   PRAPARE - Administrator, Civil Service (Medical): No    Lack of Transportation (Non-Medical): No  Physical Activity: Insufficiently Active (02/22/2024)   Exercise Vital Sign    Days of Exercise per Week: 3 days    Minutes of Exercise per Session: 40 min  Stress: No Stress Concern Present (02/22/2024)   [employer] of Occupational Health - Occupational Stress Questionnaire    Feeling of Stress: Not at all  Social Connections: Moderately Isolated (02/22/2024)   Social Connection and Isolation Panel    Frequency of Communication with Friends and Family: More than three times a week    Frequency of Social Gatherings with Friends and Family: Three times a week    Attends Religious Services:  1 to 4 times per year    Active Member of Clubs or Organizations: No    Attends Banker Meetings: Not on file    Marital Status: Never married  Intimate Partner Violence: Not At Risk (07/06/2023)   Humiliation, Afraid, Rape, and Kick questionnaire    Fear of Current or Ex-Partner: No    Emotionally Abused: No    Physically Abused: No    Sexually Abused: No     Constitutional: Pt reports chills. Denies fever, malaise, fatigue, headache or abrupt weight changes.  HEENT: Pt reports sinus pressure, ear fullness, runny nose, and nasal congestion. Denies eye pain, eye redness, ear pain, ringing in the ears, wax buildup, bloody nose, or sore throat. Respiratory: Pt reports cough. Denies difficulty breathing, shortness of breath.   Cardiovascular: Denies chest pain, chest tightness, palpitations or swelling in the hands or feet.  Gastrointestinal: Pt reports nausea. Denies abdominal pain, bloating, constipation, diarrhea or blood in the stool.  GU: Denies urgency, frequency, pain with urination, burning sensation, blood in urine, odor or discharge. Musculoskeletal: Patient reports joint pain, mainly in her right hip.  Denies decrease in range of motion, difficulty with gait, muscle pain or joint swelling.  Skin: Denies redness, rashes, lesions or ulcercations.  Neurological: Denies dizziness, difficulty with memory, difficulty with speech or problems with balance and coordination.  Psych: Denies anxiety, depression, SI/HI.  No other specific complaints in a complete review of systems (except as listed in HPI above).  Objective:   Physical Exam  BP 110/74 (BP Location: Left Arm, Patient Position: Sitting, Cuff Size: Normal)   Pulse 85   Ht 5' 5 (1.651 m)   Wt 195 lb 9.6 oz (88.7 kg)   LMP 02/20/2024 (Approximate)   SpO2 98%   BMI 32.55 kg/m    Wt Readings from Last 3 Encounters:  10/23/23 193 lb (87.5 kg)  08/07/23 189 lb 9.6 oz (86 kg)  07/26/23 186 lb 15.2 oz (84.8 kg)     General: Appears her stated age, obese, in NAD. Skin: Warm, dry and intact.  HEENT: Head: normal shape and size, maxillary sinus pressure noted; Eyes: sclera white, no icterus, conjunctiva pink, PERRLA and EOMs intact; Ear's: TM grey and intact, normal light reflex; Throat: mucosa pink and moist, + PND, no exudate, lesions noted. Neck:  Neck supple, trachea midline. No masses, lumps or thyromegaly present.  Cardiovascular: Normal rate and rhythm. S1,S2 noted.  No murmur, rubs or gallops noted. No JVD or BLE edema.  Pulmonary/Chest: Normal effort and positive vesicular breath sounds. No respiratory distress. No wheezes, rales or ronchi noted.  Abdomen: Normal bowel sounds. Musculoskeletal:  Strength 5/5 BUE/BLE. No difficulty with gait. Neurological: Alert and oriented. Cranial nerves II-XII grossly intact. Coordination normal.  Psychiatric: Mood and affect normal. Behavior is normal. Judgment and thought content normal.    BMET    Component Value Date/Time   NA 137 10/23/2023 0947   NA 136 12/22/2012 1242   K 4.0 10/23/2023 0947   K 3.7 12/22/2012 1242   CL 105 10/23/2023 0947   CL 107 12/22/2012 1242   CO2 25 10/23/2023 0947   CO2 23 12/22/2012 1242   GLUCOSE 96 10/23/2023 0947   GLUCOSE 89 12/22/2012 1242   BUN 11 10/23/2023 0947   BUN 10 12/22/2012 1242   CREATININE 0.83 10/23/2023 0947   CALCIUM  9.1 10/23/2023 0947   CALCIUM  9.7 12/22/2012 1242   GFRNONAA >60 01/25/2021 0809   GFRNONAA >60 12/22/2012 1242   GFRAA >60 12/22/2012 1242    Lipid Panel     Component Value Date/Time   CHOL 159 10/23/2023 0947   TRIG 124 10/23/2023 0947   HDL 61 10/23/2023 0947   CHOLHDL 2.6 10/23/2023 0947   LDLCALC 77 10/23/2023 0947    CBC    Component Value Date/Time   WBC 5.3 10/23/2023 0947   RBC 4.33 10/23/2023 0947   HGB 8.6 (L) 10/23/2023 0947   HGB 11.4 (L) 07/04/2013 1120   HCT 31.0 (L) 10/23/2023 0947   HCT 32.1 (L) 07/08/2013 0616   PLT 311 10/23/2023 0947   PLT  264 07/04/2013 1120   MCV 71.6 (L) 10/23/2023 0947   MCV 74 (L) 07/04/2013 1120   MCH 19.9 (L) 10/23/2023 0947   MCHC 27.7 (L) 10/23/2023 0947   RDW 17.2 (H) 10/23/2023 0947   RDW 15.5 (H) 07/04/2013 1120   LYMPHSABS 1.0 07/04/2013 1120   MONOABS 0.6 07/04/2013 1120   EOSABS 0.0 07/04/2013 1120   BASOSABS 0.0 07/04/2013 1120    Hgb A1C Lab Results  Component Value Date   HGBA1C 6.3 (H) 10/23/2023           Assessment & Plan:   Preventative health maintenance:  Flu shot declined Tetanus UTD Encouraged her to get her COVID-vaccine Prevnar 20 today Pap smear UTD Mammogram ordered-she will call to schedule Cologuard ordered Encouraged her to consume a balanced diet and exercise regimen Advised her to see an eye doctor and dentist annually Will check CBC, CMET, lipid, A1c today  Assessment and Plan    Viral sinus infection Symptoms consistent with viral sinus infection secondary to allergies. No antibiotics needed. - Prescribe prednisone  10 mg taper for six days.  Allergic rhinitis Allergic rhinitis exacerbated by seasonal changes, contributing to sinus symptoms. - Continue Zyrtec. - Advise Flonase OTC for two to three weeks.        RTC in 6 months, follow-up chronic conditions Angeline Laura, NP

## 2024-02-27 ENCOUNTER — Ambulatory Visit: Payer: Self-pay | Admitting: Internal Medicine

## 2024-02-27 LAB — COMPREHENSIVE METABOLIC PANEL WITH GFR
AG Ratio: 1.5 (calc) (ref 1.0–2.5)
ALT: 12 U/L (ref 6–29)
AST: 12 U/L (ref 10–35)
Albumin: 4.2 g/dL (ref 3.6–5.1)
Alkaline phosphatase (APISO): 50 U/L (ref 31–125)
BUN: 14 mg/dL (ref 7–25)
CO2: 23 mmol/L (ref 20–32)
Calcium: 9.3 mg/dL (ref 8.6–10.2)
Chloride: 104 mmol/L (ref 98–110)
Creat: 0.87 mg/dL (ref 0.50–0.99)
Globulin: 2.8 g/dL (ref 1.9–3.7)
Glucose, Bld: 88 mg/dL (ref 65–99)
Potassium: 3.9 mmol/L (ref 3.5–5.3)
Sodium: 136 mmol/L (ref 135–146)
Total Bilirubin: 0.2 mg/dL (ref 0.2–1.2)
Total Protein: 7 g/dL (ref 6.1–8.1)
eGFR: 84 mL/min/1.73m2 (ref >=60)

## 2024-02-27 LAB — CBC
HCT: 28.3 % — ABNORMAL LOW (ref 35.0–45.0)
Hemoglobin: 7.9 g/dL — ABNORMAL LOW (ref 11.7–15.5)
MCH: 18.6 pg — ABNORMAL LOW (ref 27.0–33.0)
MCHC: 27.9 g/dL — ABNORMAL LOW (ref 32.0–36.0)
MCV: 66.7 fL — ABNORMAL LOW (ref 80.0–100.0)
Platelets: 358 Thousand/uL (ref 140–400)
RBC: 4.24 Million/uL (ref 3.80–5.10)
RDW: 19.5 % — ABNORMAL HIGH (ref 11.0–15.0)
WBC: 5.1 Thousand/uL (ref 3.8–10.8)

## 2024-02-27 LAB — LIPID PANEL
Cholesterol: 190 mg/dL (ref <200)
HDL: 56 mg/dL (ref >=50)
LDL Cholesterol (Calc): 103 mg/dL — ABNORMAL HIGH
Non-HDL Cholesterol (Calc): 134 mg/dL — ABNORMAL HIGH (ref <130)
Total CHOL/HDL Ratio: 3.4 (calc) (ref <5.0)
Triglycerides: 195 mg/dL — ABNORMAL HIGH (ref <150)

## 2024-02-27 LAB — HEMOGLOBIN A1C
Hgb A1c MFr Bld: 6.2 % — ABNORMAL HIGH (ref <5.7)
Mean Plasma Glucose: 131 mg/dL
eAG (mmol/L): 7.3 mmol/L

## 2024-03-12 ENCOUNTER — Other Ambulatory Visit: Payer: Self-pay | Admitting: Medical Genetics

## 2024-03-12 DIAGNOSIS — Z006 Encounter for examination for normal comparison and control in clinical research program: Secondary | ICD-10-CM

## 2024-05-07 ENCOUNTER — Other Ambulatory Visit: Payer: Self-pay

## 2024-08-27 ENCOUNTER — Ambulatory Visit: Payer: MEDICAID | Admitting: Internal Medicine
